# Patient Record
Sex: Female | Born: 1980 | Race: White | Hispanic: No | Marital: Single | Smoking: Current every day smoker
Health system: Southern US, Community
[De-identification: ages and names within clinical notes are randomized; demographics above are authoritative.]

## PROBLEM LIST (undated history)

## (undated) DIAGNOSIS — Z21 Asymptomatic human immunodeficiency virus [HIV] infection status: Secondary | ICD-10-CM

## (undated) DIAGNOSIS — K859 Acute pancreatitis without necrosis or infection, unspecified: Secondary | ICD-10-CM

## (undated) DIAGNOSIS — B20 Human immunodeficiency virus [HIV] disease: Secondary | ICD-10-CM

## (undated) HISTORY — PX: OTHER SURGICAL HISTORY: SHX169

## (undated) HISTORY — PX: CHOLECYSTECTOMY: SHX55

---

## 2014-07-12 ENCOUNTER — Inpatient Hospital Stay (HOSPITAL_COMMUNITY)
Admit: 2014-07-12 | Discharge: 2014-07-16 | DRG: 975 | Disposition: A | Discharge status: Home / Self Care | Payer: Medicaid - Out of State | Source: Other Acute Inpatient Hospital | Attending: Internal Medicine | Admitting: Internal Medicine

## 2014-07-12 ENCOUNTER — Telehealth: Payer: Self-pay | Admitting: Internal Medicine

## 2014-07-12 ENCOUNTER — Encounter (HOSPITAL_COMMUNITY): Payer: Self-pay | Admitting: Internal Medicine

## 2014-07-12 ENCOUNTER — Inpatient Hospital Stay (HOSPITAL_COMMUNITY)
Admission: AD | Admit: 2014-07-12 | Discharge status: Absent without leave | Payer: Medicaid - Out of State | Source: Other Acute Inpatient Hospital | Attending: Internal Medicine | Admitting: Internal Medicine

## 2014-07-12 DIAGNOSIS — Z9049 Acquired absence of other specified parts of digestive tract: Secondary | ICD-10-CM | POA: Diagnosis present

## 2014-07-12 DIAGNOSIS — N2589 Other disorders resulting from impaired renal tubular function: Secondary | ICD-10-CM | POA: Diagnosis present

## 2014-07-12 DIAGNOSIS — R0602 Shortness of breath: Secondary | ICD-10-CM | POA: Diagnosis present

## 2014-07-12 DIAGNOSIS — B59 Pneumocystosis: Principal | ICD-10-CM | POA: Diagnosis present

## 2014-07-12 DIAGNOSIS — B20 Human immunodeficiency virus [HIV] disease: Secondary | ICD-10-CM | POA: Diagnosis present

## 2014-07-12 DIAGNOSIS — N179 Acute kidney failure, unspecified: Secondary | ICD-10-CM

## 2014-07-12 DIAGNOSIS — Z9119 Patient's noncompliance with other medical treatment and regimen: Secondary | ICD-10-CM | POA: Diagnosis present

## 2014-07-12 DIAGNOSIS — F17210 Nicotine dependence, cigarettes, uncomplicated: Secondary | ICD-10-CM | POA: Diagnosis present

## 2014-07-12 DIAGNOSIS — R1013 Epigastric pain: Secondary | ICD-10-CM | POA: Diagnosis present

## 2014-07-12 DIAGNOSIS — F1021 Alcohol dependence, in remission: Secondary | ICD-10-CM | POA: Diagnosis present

## 2014-07-12 DIAGNOSIS — J13 Pneumonia due to Streptococcus pneumoniae: Secondary | ICD-10-CM | POA: Diagnosis present

## 2014-07-12 DIAGNOSIS — J189 Pneumonia, unspecified organism: Secondary | ICD-10-CM | POA: Diagnosis present

## 2014-07-12 DIAGNOSIS — Z21 Asymptomatic human immunodeficiency virus [HIV] infection status: Secondary | ICD-10-CM | POA: Diagnosis present

## 2014-07-12 DIAGNOSIS — R109 Unspecified abdominal pain: Secondary | ICD-10-CM

## 2014-07-12 HISTORY — DX: Acute pancreatitis without necrosis or infection, unspecified: K85.90

## 2014-07-12 HISTORY — DX: Human immunodeficiency virus (HIV) disease: B20

## 2014-07-12 HISTORY — DX: Asymptomatic human immunodeficiency virus (hiv) infection status: Z21

## 2014-07-12 MED ORDER — MORPHINE SULFATE 2 MG/ML IJ SOLN
0.5000 mg | INTRAMUSCULAR | Status: DC | PRN
  Administered 2014-07-13 – 2014-07-14 (×8): 0.5 mg via INTRAVENOUS
  Filled 2014-07-12 (×8): qty 1

## 2014-07-12 NOTE — Telephone Encounter (Signed)
Chest x-ray findings from [facility] showed bilateral pneumonia. Creatinine 0.8 06/13/2014. Solumedrol 125 mg. Started on Bactrim, Ceftriaxone, and Azithromycin at [facility].  Kielan Dreisbach A, MD 07/12/2014, 4:49 PM

## 2014-07-12 NOTE — Telephone Encounter (Signed)
Name: Ardelle LeschesCrystal C Diemer MRN: [id number] DOB: 03/07/1981  Brief History: 34 year old female with history of HIV, COPD, chronic bronchitis, chronic pain syndrome, bipolar disorder who presented to [facility] with complaints of fever.  Patient reported fever of 104 last night.  Patient was found to have a WBC of 1.0, BUN of 40, creatinine 2.0 (Acute renal failure).  Patient found to be in acute renal failure.  ABG showed pH 7.37, PO2 70, PCO2 of 26.  Per ED physician, patient has infectious diseases doctor in [place].  Vitals: 98.7, 96, 20, 104/53, 97% on room air.  Bed requested: Medical bed, depending [facility].  Andreas BlowerEDDY,Trygg Mantz A, MD 07/12/2014, 4:47 PM

## 2014-07-12 NOTE — Progress Notes (Signed)
CRITICAL VALUE ALERT  Critical value received:  WBC 1.1  Date of notification:  07/12/2014  Time of notification:  11:48 PM  Critical value read back:Yes.    Nurse who received alert:  Joya SalmBindu Joy, RN  MD notified (1st page):  Toniann FailKakrakandy, MD  Time of first page:  11:49 PM  MD notified (2nd page):  Time of second page:  Responding MD:  Toniann FailKakrakandy, MD  Time MD responded:  11:49 PM

## 2014-07-13 ENCOUNTER — Encounter (HOSPITAL_COMMUNITY): Payer: Self-pay | Admitting: Radiology

## 2014-07-13 ENCOUNTER — Inpatient Hospital Stay (HOSPITAL_COMMUNITY): Payer: Medicaid - Out of State

## 2014-07-13 DIAGNOSIS — R1013 Epigastric pain: Secondary | ICD-10-CM | POA: Diagnosis present

## 2014-07-13 DIAGNOSIS — J189 Pneumonia, unspecified organism: Secondary | ICD-10-CM | POA: Diagnosis present

## 2014-07-13 DIAGNOSIS — N179 Acute kidney failure, unspecified: Secondary | ICD-10-CM | POA: Diagnosis present

## 2014-07-13 LAB — CREATININE, URINE, RANDOM: Creatinine, Urine: 49.6 mg/dL

## 2014-07-13 LAB — CBC WITH DIFFERENTIAL/PLATELET
Basophils Absolute: 0 10*3/uL (ref 0.0–0.1)
Basophils Relative: 0 % (ref 0–1)
EOS PCT: 1 % (ref 0–5)
Eosinophils Absolute: 0 10*3/uL (ref 0.0–0.7)
HCT: 28.2 % — ABNORMAL LOW (ref 36.0–46.0)
Hemoglobin: 9.6 g/dL — ABNORMAL LOW (ref 12.0–15.0)
LYMPHS ABS: 0.3 10*3/uL — AB (ref 0.7–4.0)
Lymphocytes Relative: 22 % (ref 12–46)
MCH: 34.9 pg — AB (ref 26.0–34.0)
MCHC: 34 g/dL (ref 30.0–36.0)
MCV: 102.5 fL — AB (ref 78.0–100.0)
Monocytes Absolute: 0.2 10*3/uL (ref 0.1–1.0)
Monocytes Relative: 18 % — ABNORMAL HIGH (ref 3–12)
Neutro Abs: 0.7 10*3/uL — ABNORMAL LOW (ref 1.7–7.7)
Neutrophils Relative %: 59 % (ref 43–77)
PLATELETS: 134 10*3/uL — AB (ref 150–400)
RBC: 2.75 MIL/uL — AB (ref 3.87–5.11)
RDW: 13.8 % (ref 11.5–15.5)
WBC: 1.2 10*3/uL — CL (ref 4.0–10.5)

## 2014-07-13 LAB — COMPREHENSIVE METABOLIC PANEL
ALBUMIN: 3.2 g/dL — AB (ref 3.5–5.2)
ALT: 19 U/L (ref 0–35)
ANION GAP: 9 (ref 5–15)
AST: 30 U/L (ref 0–37)
Alkaline Phosphatase: 105 U/L (ref 39–117)
BUN: 33 mg/dL — ABNORMAL HIGH (ref 6–23)
CALCIUM: 8.5 mg/dL (ref 8.4–10.5)
CO2: 19 mmol/L (ref 19–32)
CREATININE: 1.49 mg/dL — AB (ref 0.50–1.10)
Chloride: 113 mEq/L — ABNORMAL HIGH (ref 96–112)
GFR calc Af Amer: 52 mL/min — ABNORMAL LOW (ref >90)
GFR calc non Af Amer: 45 mL/min — ABNORMAL LOW (ref >90)
Glucose, Bld: 137 mg/dL — ABNORMAL HIGH (ref 70–99)
Potassium: 3.8 mmol/L (ref 3.5–5.1)
Sodium: 141 mmol/L (ref 135–145)
TOTAL PROTEIN: 8 g/dL (ref 6.0–8.3)
Total Bilirubin: 0.7 mg/dL (ref 0.3–1.2)

## 2014-07-13 LAB — URINALYSIS W MICROSCOPIC (NOT AT ARMC)
Bilirubin Urine: NEGATIVE
Glucose, UA: NEGATIVE mg/dL
Hgb urine dipstick: NEGATIVE
Ketones, ur: NEGATIVE mg/dL
Leukocytes, UA: NEGATIVE
Nitrite: NEGATIVE
Protein, ur: 100 mg/dL — AB
Specific Gravity, Urine: 1.014 (ref 1.005–1.030)
Urobilinogen, UA: 1 mg/dL (ref 0.0–1.0)
pH: 6.5 (ref 5.0–8.0)

## 2014-07-13 LAB — BRAIN NATRIURETIC PEPTIDE: B NATRIURETIC PEPTIDE 5: 117.9 pg/mL — AB (ref 0.0–100.0)

## 2014-07-13 LAB — STREP PNEUMONIAE URINARY ANTIGEN: STREP PNEUMO URINARY ANTIGEN: POSITIVE — AB

## 2014-07-13 LAB — SODIUM, URINE, RANDOM: Sodium, Ur: 80 mmol/L

## 2014-07-13 LAB — PREGNANCY, URINE: PREG TEST UR: NEGATIVE

## 2014-07-13 LAB — LACTATE DEHYDROGENASE: LDH: 263 U/L — ABNORMAL HIGH (ref 94–250)

## 2014-07-13 LAB — LIPASE, BLOOD: LIPASE: 24 U/L (ref 11–59)

## 2014-07-13 LAB — TROPONIN I

## 2014-07-13 MED ORDER — IOHEXOL 300 MG/ML  SOLN: 100.0000 mL | Freq: Once | INTRAMUSCULAR | Status: AC | PRN

## 2014-07-13 MED ORDER — QUETIAPINE FUMARATE 300 MG PO TABS
600.0000 mg | ORAL_TABLET | Freq: Every day | ORAL | Status: DC
  Administered 2014-07-13 – 2014-07-15 (×4): 600 mg via ORAL
  Filled 2014-07-13 (×5): qty 2

## 2014-07-13 MED ORDER — AZITHROMYCIN 500 MG PO TABS
500.0000 mg | ORAL_TABLET | ORAL | Status: DC
Start: 2014-07-13 — End: 2014-07-15
  Administered 2014-07-13 – 2014-07-15 (×3): 500 mg via ORAL
  Filled 2014-07-13 (×3): qty 1

## 2014-07-13 MED ORDER — CEFTRIAXONE SODIUM IN DEXTROSE 20 MG/ML IV SOLN
1.0000 g | INTRAVENOUS | Status: DC
  Administered 2014-07-13 – 2014-07-16 (×4): 1 g via INTRAVENOUS
  Filled 2014-07-13 (×5): qty 50

## 2014-07-13 MED ORDER — RITONAVIR 100 MG PO CAPS
100.0000 mg | ORAL_CAPSULE | Freq: Every day | ORAL | Status: DC
  Filled 2014-07-13: qty 1

## 2014-07-13 MED ORDER — EMTRICITABINE-TENOFOVIR DF 200-300 MG PO TABS
1.0000 | ORAL_TABLET | Freq: Every day | ORAL | Status: DC
  Administered 2014-07-13 (×2): 1 via ORAL
  Filled 2014-07-13 (×3): qty 1

## 2014-07-13 MED ORDER — ENOXAPARIN SODIUM 40 MG/0.4ML ~~LOC~~ SOLN
40.0000 mg | SUBCUTANEOUS | Status: DC
  Administered 2014-07-15: 40 mg via SUBCUTANEOUS
  Filled 2014-07-13 (×4): qty 0.4

## 2014-07-13 MED ORDER — SULFAMETHOXAZOLE-TRIMETHOPRIM 400-80 MG/5ML IV SOLN
320.0000 mg | Freq: Four times a day (QID) | INTRAVENOUS | Status: DC
  Administered 2014-07-13 – 2014-07-14 (×6): 320 mg via INTRAVENOUS
  Filled 2014-07-13 (×13): qty 20

## 2014-07-13 MED ORDER — GABAPENTIN 400 MG PO CAPS
400.0000 mg | ORAL_CAPSULE | Freq: Four times a day (QID) | ORAL | Status: DC
  Administered 2014-07-13 – 2014-07-16 (×13): 400 mg via ORAL
  Filled 2014-07-13 (×16): qty 1

## 2014-07-13 MED ORDER — NICOTINE 21 MG/24HR TD PT24
21.0000 mg | MEDICATED_PATCH | Freq: Every day | TRANSDERMAL | Status: DC
  Administered 2014-07-13 – 2014-07-16 (×4): 21 mg via TRANSDERMAL
  Filled 2014-07-13 (×4): qty 1

## 2014-07-13 MED ORDER — RITONAVIR 100 MG PO TABS
100.0000 mg | ORAL_TABLET | Freq: Every day | ORAL | Status: DC
  Administered 2014-07-13 – 2014-07-15 (×4): 100 mg via ORAL
  Filled 2014-07-13 (×5): qty 1

## 2014-07-13 MED ORDER — ATAZANAVIR SULFATE 150 MG PO CAPS
300.0000 mg | ORAL_CAPSULE | Freq: Every day | ORAL | Status: DC
  Administered 2014-07-13 – 2014-07-15 (×4): 300 mg via ORAL
  Filled 2014-07-13 (×5): qty 2

## 2014-07-13 MED ORDER — SODIUM CHLORIDE 0.9 % IV SOLN
INTRAVENOUS | Status: DC
  Administered 2014-07-13: 01:00:00 via INTRAVENOUS

## 2014-07-13 NOTE — Progress Notes (Signed)
PROGRESS NOTE  Sherry Strickland ZOX:[id number] DOB: 15-Jan-1981 DOA: 07/12/2014 PCP: Sherry Strickland  Brief history 34 year old female with a history of HIV presented with 4 day history of shortness of breath, cough, and fevers. The [facility] and transferred to Sherry Strickland. Chest x-ray report for bibasilar infiltrates. The Strickland had been noncompliant with her antiretroviral therapy. She states that she just started back on Norvir, Truvada, and Reyataz. However her history is very inconsistent. She states that she last saw her infectious disease physician 6 weeks ago, but interestingly, the Strickland was not placed on PCP or MAI prophylaxis. However, she states that she was started on the above antiretroviral regimen at that time. She decided not to take it up until 3 weeks ago.  She states that his absolute CD4 count was 2, five months ago.  The Strickland states that she was diagnosed with PCP pneumonia when she was diagnosed with HIV 10 years ago. She states that she has not been on an anti-retroviral regimen up until 6 weeks ago. Assessment/Plan: Pneumonia -Although the Strickland has a positive urine Streptococcus pneumonia antigen, this is nonspecific -Continue intravenous trimethoprim sulfamethoxazole -Continue treatment for CAP with ceftriaxone and azithromycin -The Strickland is not hypoxemic--PaO2 70, oxygen saturation 97% on room air -Sputum for PCP--not sure if the Strickland is able to produce any at this time but will try -LDH is minimally elevated, but again this is nonspecific Tobacco abuse -She continues to smoke 2 packs per day -Tobacco cessation discussed -NicoDerm patch Abdominal pain -Etiology unclear -CT abdomen and pelvis without contrast Acute kidney injury -Strickland [facility] -Serum creatinine improved to 1.49 -Unclear renal baseline -Continue IV fluids -Renal ultrasound to clarify if she has echogenic kidneys,  hydronephrosis -Fractional excretion of sodium 1.7% Pancytopenia -Due to sepsis in the setting of AIDS Medical noncompliance -pt counseled   Family Communication:   Pt at beside Disposition Plan:   Home when medically stable     Antibiotics:  Ceftriaxone  07/13/14>>>  azithro 07/13/14>>>  TMP/SMZ 07/13/14>>>    Procedures/Studies: Dg Chest Port 1 View  07/13/2014   CLINICAL DATA:  Pneumonia.  Cough.  Shortness of breath.  EXAM: PORTABLE CHEST - 1 VIEW  COMPARISON:  07/12/2014.  FINDINGS: Mediastinum and hilar structures normal. Heart size is stable. Persistent bibasilar pulmonary infiltrates are noted most consistent with pneumonia. Pulmonary edema cannot be excluded. Sherry pleural effusion or pneumothorax.  IMPRESSION: Persistent patchy bibasilar pulmonary infiltrates most consistent with pneumonia. Pulmonary edema cannot be excluded.   Electronically Signed   By: Sherry Strickland  Strickland   On: 07/13/2014 07:55         Subjective: Strickland continues to complain of shortness of breath not much better than yesterday. Denies any nausea, vomiting, diarrhea, dysuria, hematuria. Continues to complain of epigastric and left lower quadrant abdominal pain.  Objective: Filed Vitals:   07/12/14 2300 07/13/14 0656  BP:  103/52  Pulse:  85  Temp:  98.6 F (37 C)  TempSrc:  Oral  Resp:  21  Height: 5\' 4"  (1.626 m)   Weight: 89.631 kg (197 lb 9.6 oz)   SpO2:  97%    Intake/Output Summary (Last 24 hours) at 07/13/14 1128 Last data filed at 07/13/14 1028  Gross per 24 hour  Intake    240 ml  Output      0 ml  Net    240 ml  Weight change:  Exam:   General:  Pt is alert, follows commands appropriately, not in acute distress  HEENT: Sherry icterus, Sherry thrush,  [place]/AT  Cardiovascular: RRR, S1/S2, Sherry rubs, Sherry gallops  Respiratory: Bibasilar crackles. Sherry wheezing  Abdomen: Soft/+BS, non tender, non distended, Sherry guarding  Extremities: 1+LE edema, Sherry lymphangitis, Sherry petechiae, Sherry  rashes, Sherry synovitis  Data Reviewed: Basic Metabolic Panel:  Recent Labs Lab 07/13/14 0103  NA 141  K 3.8  CL 113*  CO2 19  GLUCOSE 137*  BUN 33*  CREATININE 1.49*  CALCIUM 8.5   Liver Function Tests:  Recent Labs Lab 07/13/14 0103  AST 30  ALT 19  ALKPHOS 105  BILITOT 0.7  PROT 8.0  ALBUMIN 3.2*    Recent Labs Lab 07/13/14 0103  LIPASE 24   Sherry results for input(s): AMMONIA in the last 168 hours. CBC:  Recent Labs Lab 07/13/14 0103  WBC 1.2*  NEUTROABS 0.7*  HGB 9.6*  HCT 28.2*  MCV 102.5*  PLT 134*   Cardiac Enzymes:  Recent Labs Lab 07/13/14 0103  TROPONINI <0.03   BNP: Invalid input(s): POCBNP CBG: Sherry results for input(s): GLUCAP in the last 168 hours.  Sherry results found for this or any previous visit (from the past 240 hour(s)).   Scheduled Meds: . atazanavir  300 mg Oral Q supper  . azithromycin  500 mg Oral Q24H  . cefTRIAXone (ROCEPHIN)  IV  1 g Intravenous Q24H  . emtricitabine-tenofovir  1 tablet Oral QHS  . enoxaparin (LOVENOX) injection  40 mg Subcutaneous Q24H  . gabapentin  400 mg Oral QID  . QUEtiapine  600 mg Oral QHS  . ritonavir  100 mg Oral Q supper  . sulfamethoxazole-trimethoprim  320 mg Intravenous Q6H   Continuous Infusions: . sodium chloride 50 mL/hr at 07/13/14 0114     Sherry Maalouf, DO  [facility] Pager [phone]  If 7PM-7AM, please contact night-coverage [email or web address] Password [email or web address] 07/13/2014, 11:28 AM   LOS: 1 day

## 2014-07-13 NOTE — Progress Notes (Signed)
UR Completed.  [phone]  

## 2014-07-13 NOTE — Progress Notes (Signed)
ANTIBIOTIC CONSULT NOTE - INITIAL  Pharmacy Consult for Septra Indication: PCP  No Known Allergies  Patient Measurements: Height: 5\' 4"  (162.6 cm) Weight: 197 lb 9.6 oz (89.631 kg) IBW/kg (Calculated) : 54.7  Vital Signs:   Intake/Output from previous day:   Intake/Output from this shift:    Labs (at [facility]) : WBC  1.0 Hgb  10.9 Hct  31.2 Plt  120  SCr 2.4 ( CrCl ~ 40 mL/min No results for input(s): WBC, HGB, PLT, LABCREA, CREATININE in the last 72 hours. CrCl cannot be calculated (Patient has no serum creatinine result on file.). No results for input(s): VANCOTROUGH, VANCOPEAK, VANCORANDOM, GENTTROUGH, GENTPEAK, GENTRANDOM, TOBRATROUGH, TOBRAPEAK, TOBRARND, AMIKACINPEAK, AMIKACINTROU, AMIKACIN in the last 72 hours.   Microbiology: No results found for this or any previous visit (from the past 720 hour(s)).  Medical History: Past Medical History  Diagnosis Date  . HIV (human immunodeficiency virus infection)   . Pancreatitis     Medications:  Reyataz  Truvada  Norvir  Neurontin  Seroquel    Assessment: 34 yo female with PNA, HIV+, for empiric antibiotics  Plan:  Septra 320 mg IV q6h  Jevonte Clanton, Gary FleetGregory Vernon 07/13/2014,12:45 AM

## 2014-07-13 NOTE — H&P (Signed)
[facility] History and Physical  Sherry Strickland ZOX:[id number] DOB: May 16, 1981 DOA: 07/12/2014  Referring physician: [facility] was [facility]. PCP: No PCP Per Patient   Chief Complaint: Shortness of breath.  HPI: Sherry Strickland is a 34 y.o. female with history of HIV, as per the patient last CD4 count was 2, 6 months ago presents [facility] because of shortness of breath. Patient states she has been short of breath over the last 4 days with productive cough and subjective feeling of fever and chills. Denies any chest pain. In the ER chest x-ray showed bilateral lower lobe opacities concerning for pneumonia. Patient was [facility] as [facility] did not have any infectious disease consultant. On my exam patient is not in distress and is not hypoxic. Patient has had recent cholecystectomy last month and also was told she has had pancreatitis 2 months ago for which she was hospitalized and patient states since then she has stopped drinking alcohol. [facility] shows pancytopenia with WBC count of 1 and hemoglobin of 10.9 and platelets of 120 creatinine was around 2.4 with a bicarbonate of 18. We do not have access to patient's baseline labs. Troponins was negative LFTs showed AST and ALT of 20 bilirubin was within acceptable limits. Drug screen was positive for benzodiazepine.   Review of Systems: As presented in the history of presenting illness, rest negative.  Past Medical History  Diagnosis Date  . HIV (human immunodeficiency virus infection)   . Pancreatitis    Past Surgical History  Procedure Laterality Date  . Cholecystectomy    . Left tibia surgery     Social History:  reports that she has been smoking.  She does not have any smokeless tobacco history on file. She reports that she does not drink alcohol or use illicit drugs. Where does patient live home. Can patient participate in  ADLs? Yes.  No Known Allergies  Family History:  Family History  Problem Relation Age of Onset  . Bipolar disorder Mother       Prior to Admission medications   Medication Sig Start Date End Date Taking? Authorizing Provider  atazanavir (REYATAZ) 150 MG capsule Take 300 mg by mouth at bedtime.   Yes Historical Provider, MD  emtricitabine-tenofovir (TRUVADA) 200-300 MG per tablet Take 1 tablet by mouth at bedtime.   Yes Historical Provider, MD  gabapentin (NEURONTIN) 400 MG capsule Take 400 mg by mouth 4 (four) times daily.   Yes Historical Provider, MD  PRESCRIPTION MEDICATION Inhale 2 puffs into the lungs 2 (two) times daily as needed (wheezing, shortness of breath).    Yes Historical Provider, MD  QUEtiapine (SEROQUEL) 300 MG tablet Take 600 mg by mouth at bedtime.   Yes Historical Provider, MD  ritonavir (NORVIR) 100 MG capsule Take 100 mg by mouth at bedtime.   Yes Historical Provider, MD    Physical Exam: There were no vitals filed for this visit.   General:  Moderately built and nourished.  Eyes: Anicteric no pallor.  ENT: No discharge from the ears eyes nose or mouth.  Neck: No mass felt. No JVD appreciated.  Cardiovascular: S1 and S2 heard.  Respiratory: No rhonchi or crepitations.  Abdomen: Soft nontender bowel sounds present.  Skin: No rash.  Musculoskeletal: No edema.  Psychiatric: Appears normal.  Neurologic: Alert awake oriented to time place and person. Moves all extremities.  Labs on Admission:  Basic Metabolic Panel: No  results for input(s): NA, K, CL, CO2, GLUCOSE, BUN, CREATININE, CALCIUM, MG, PHOS in the last 168 hours. Liver Function Tests: No results for input(s): AST, ALT, ALKPHOS, BILITOT, PROT, ALBUMIN in the last 168 hours. No results for input(s): LIPASE, AMYLASE in the last 168 hours. No results for input(s): AMMONIA in the last 168 hours. CBC: No results for input(s): WBC, NEUTROABS, HGB, HCT, MCV, PLT in the last 168 hours. Cardiac  Enzymes: No results for input(s): CKTOTAL, CKMB, CKMBINDEX, TROPONINI in the last 168 hours.  BNP (last 3 results) No results for input(s): PROBNP in the last 8760 hours. CBG: No results for input(s): GLUCAP in the last 168 hours.  Radiological Exams on Admission: No results found.  EKG: Independently reviewed. E[facility] done at [facility] showed normal sinus rhythm.  Assessment/Plan Principal Problem:   CAP (community acquired pneumonia) Active Problems:   HIV (human immunodeficiency virus infection)   Pneumonia   Acute renal failure   Abdominal pain, epigastric   1. Community acquired pneumonia - patient has been placed on ceftriaxone and Zithromax. Since patient has HIV and CD4 counts were low at this time patient has been placed on Bactrim per pharmacy to dose for pneumocystis pneumonia. Check LDH sputum cultures repeat chest x-ray in a.m. Closely observe for now. Since patient is not hypoxic I have not placed patient on prednisone. Check urine strep and Legionella antigen. 2. HIV/AIDS - patient states her last CD4 count was 2. Patient states she has been taking her antiretrovirals over the last 3 weeks regularly. Check CD4 count and viral load. May consult infectious disease in a.m. 3. Renal failure probably acute - patient's creatinine was around 2.4 and we do not have any labs to compare with. Repeat labs closely follow intake and output and metabolic panel. If patient's creatinine shows an increasing trend and may have to change Bactrim. 4. Epigastric discomfort with history of alcoholic pancreatitis - check lipase levels. 5. Pancytopenia - may be related to HIV. Patient also has history of alcoholism and not sure if patient has cirrhosis. Closely follow CBC. 6. Recent cholecystectomy.  Repeat labs has been ordered.   DVT Prophylaxis Lovenox.  Code Status: Full code.  Family Communication: None.  Disposition Plan: Admit to inpatient.    KAKRAKANDY,ARSHAD N. [facility] Pager [phone].  If 7PM-7AM, please contact night-coverage [email or web address] Password [facility] 07/13/2014, 12:07 AM

## 2014-07-14 DIAGNOSIS — B59 Pneumocystosis: Secondary | ICD-10-CM | POA: Insufficient documentation

## 2014-07-14 DIAGNOSIS — J13 Pneumonia due to Streptococcus pneumoniae: Secondary | ICD-10-CM | POA: Insufficient documentation

## 2014-07-14 DIAGNOSIS — N2589 Other disorders resulting from impaired renal tubular function: Secondary | ICD-10-CM | POA: Insufficient documentation

## 2014-07-14 DIAGNOSIS — N179 Acute kidney failure, unspecified: Secondary | ICD-10-CM | POA: Insufficient documentation

## 2014-07-14 DIAGNOSIS — R1013 Epigastric pain: Secondary | ICD-10-CM

## 2014-07-14 DIAGNOSIS — J189 Pneumonia, unspecified organism: Secondary | ICD-10-CM

## 2014-07-14 DIAGNOSIS — Z21 Asymptomatic human immunodeficiency virus [HIV] infection status: Secondary | ICD-10-CM

## 2014-07-14 LAB — EXPECTORATED SPUTUM ASSESSMENT W GRAM STAIN, RFLX TO RESP C

## 2014-07-14 LAB — LACTIC ACID, PLASMA: LACTIC ACID, VENOUS: 1.9 mmol/L (ref 0.5–2.2)

## 2014-07-14 LAB — BASIC METABOLIC PANEL
Anion gap: 8 (ref 5–15)
BUN: 24 mg/dL — ABNORMAL HIGH (ref 6–23)
CALCIUM: 8.4 mg/dL (ref 8.4–10.5)
CO2: 14 mmol/L — ABNORMAL LOW (ref 19–32)
Chloride: 113 mEq/L — ABNORMAL HIGH (ref 96–112)
Creatinine, Ser: 1.06 mg/dL (ref 0.50–1.10)
GFR calc non Af Amer: 68 mL/min — ABNORMAL LOW (ref >90)
GFR, EST AFRICAN AMERICAN: 78 mL/min — AB (ref >90)
GLUCOSE: 137 mg/dL — AB (ref 70–99)
Potassium: 4.2 mmol/L (ref 3.5–5.1)
Sodium: 135 mmol/L (ref 135–145)

## 2014-07-14 LAB — EXPECTORATED SPUTUM ASSESSMENT W REFEX TO RESP CULTURE

## 2014-07-14 LAB — HIV-1 RNA QUANT-NO REFLEX-BLD
HIV 1 RNA Quant: 24731 copies/mL — ABNORMAL HIGH (ref <20)
HIV-1 RNA QUANT, LOG: 4.39 {Log} — AB (ref <1.30)

## 2014-07-14 LAB — CBC
HCT: 26.8 % — ABNORMAL LOW (ref 36.0–46.0)
Hemoglobin: 9.2 g/dL — ABNORMAL LOW (ref 12.0–15.0)
MCH: 35.5 pg — ABNORMAL HIGH (ref 26.0–34.0)
MCHC: 34.3 g/dL (ref 30.0–36.0)
MCV: 103.5 fL — AB (ref 78.0–100.0)
PLATELETS: 154 10*3/uL (ref 150–400)
RBC: 2.59 MIL/uL — ABNORMAL LOW (ref 3.87–5.11)
RDW: 14.1 % (ref 11.5–15.5)
WBC: 1.9 10*3/uL — AB (ref 4.0–10.5)

## 2014-07-14 LAB — INFLUENZA PANEL BY PCR (TYPE A & B)
H1N1FLUPCR: NOT DETECTED
INFLAPCR: NEGATIVE
INFLBPCR: NEGATIVE

## 2014-07-14 LAB — LEGIONELLA ANTIGEN, URINE

## 2014-07-14 MED ORDER — EMTRICITABINE-TENOFOVIR DF 200-300 MG PO TABS
1.0000 | ORAL_TABLET | Freq: Every day | ORAL | Status: DC
  Administered 2014-07-15: 1 via ORAL
  Filled 2014-07-14 (×2): qty 1

## 2014-07-14 MED ORDER — GI COCKTAIL ~~LOC~~
30.0000 mL | Freq: Three times a day (TID) | ORAL | Status: DC | PRN
  Filled 2014-07-14: qty 30

## 2014-07-14 MED ORDER — PANTOPRAZOLE SODIUM 40 MG PO TBEC: 40.0000 mg | DELAYED_RELEASE_TABLET | Freq: Every day | ORAL | Status: DC

## 2014-07-14 MED ORDER — FAMOTIDINE 20 MG PO TABS
20.0000 mg | ORAL_TABLET | Freq: Every day | ORAL | Status: DC
  Administered 2014-07-15 – 2014-07-16 (×2): 20 mg via ORAL
  Filled 2014-07-14 (×2): qty 1

## 2014-07-14 MED ORDER — MORPHINE SULFATE 2 MG/ML IJ SOLN: 1.0000 mg | INTRAMUSCULAR | Status: DC | PRN

## 2014-07-14 MED ORDER — OXYCODONE HCL 5 MG PO TABS
5.0000 mg | ORAL_TABLET | Freq: Four times a day (QID) | ORAL | Status: DC | PRN
Start: 2014-07-14 — End: 2014-07-16
  Administered 2014-07-14 – 2014-07-16 (×8): 5 mg via ORAL
  Filled 2014-07-14 (×8): qty 1

## 2014-07-14 MED ORDER — PRIMAQUINE PHOSPHATE 26.3 MG PO TABS
30.0000 mg | ORAL_TABLET | Freq: Every day | ORAL | Status: DC
  Administered 2014-07-14 – 2014-07-16 (×3): 30 mg via ORAL
  Filled 2014-07-14 (×3): qty 2

## 2014-07-14 MED ORDER — ALUM & MAG HYDROXIDE-SIMETH 200-200-20 MG/5ML PO SUSP: 30.0000 mL | Freq: Four times a day (QID) | ORAL | Status: DC | PRN

## 2014-07-14 MED ORDER — CLINDAMYCIN PHOSPHATE 900 MG/50ML IV SOLN
900.0000 mg | Freq: Three times a day (TID) | INTRAVENOUS | Status: DC
  Administered 2014-07-14 – 2014-07-15 (×3): 900 mg via INTRAVENOUS
  Filled 2014-07-14 (×5): qty 50

## 2014-07-14 NOTE — Progress Notes (Signed)
PROGRESS NOTE  Sherry Strickland ZOX:[id number] DOB: 1981/04/11 DOA: 07/12/2014 PCP: No PCP Per Patient  HPI/Subjective: 34 y/o female with hx of HIV and non-compliance with HIV medications, per pt. Has a CD4 count of 2 5 months ago.  Presented with a 4 day hx of SOB, productive cough, and fevers with chills.  The patient was at [facility] and was transferred to [facility] because [facility] doesn't have and ID consultant.  Chest x-ray in the ED showed bilateral lower lobe infiltrates most consistent with pneumonia. The patient was diagnosed with PCP pneumonia when she was diagnosed with HIV 10 years ago. Pt decided to start taking her antiretroviral regiman 3 weeks ago: Norvir, Truvada, and Reyataz.    Hx of cholecystectomy and pancreatitis from alcohol use 2 months ago.    Assessment/Plan:  Pneumonia: Not sure if Bacterial CAP vs PCP (given hx of HIV)-although Urine Strep Antigen positive Not hypoxic, not toxic appearing. No fever, but leukopenic, suspect somewhat improved Continue empiric Rocephin/Zithromax and Bactrim ID consult for Abx management  Abdominal Pain: Gives prior hx of pancreatitis following binging on Alcohol, claims underwent cholecystectomy a few months back. CT Abd on 1/12 negative, Lipase within normal limits as well, belly is soft-with some mild epigastric tenderness Start Pepcid, Maalox prn,supportive care with prn Narcotics Follow  ARF: Secondary to pre-renal Azotemia Resolved with IVF Monitor periodically-on Bactrim  Pancytopenia: Secondary to HIV/AID's Follow CBC periodcially  Non Anion Gap Acidosis: ?etiology-?from ARF-bicarb lagging behind renal improvement. ?from Truvada-check lactic acid  Tobacco Abuse: Counseled Continue Transdermal Nicotine  DVT Prophylaxis:  Lovenox  Code Status: Full Family Communication: no family at bedside Disposition Plan: home when medically  stable   Consultants:  ID  Procedures:  None  Antibiotics: Anti-infectives    Start     Dose/Rate Route Frequency Ordered Stop   07/13/14 1400  cefTRIAXone (ROCEPHIN) 1 g in dextrose 5 % 50 mL IVPB - Premix     1 g100 mL/hr over 30 Minutes Intravenous Every 24 hours 07/13/14 0006 07/20/14 1359   07/13/14 1000  azithromycin (ZITHROMAX) tablet 500 mg     500 mg Oral Every 24 hours 07/13/14 0006 07/20/14 0959   07/13/14 0200  sulfamethoxazole-trimethoprim (BACTRIM) 320 mg in dextrose 5 % 500 mL IVPB     320 mg346.7 mL/hr over 90 Minutes Intravenous Every 6 hours 07/13/14 0050     07/13/14 0100  ritonavir (NORVIR) tablet 100 mg     100 mg Oral Daily with supper 07/13/14 0057     07/13/14 0045  atazanavir (REYATAZ) capsule 300 mg     300 mg Oral Daily with supper 07/13/14 0006     07/13/14 0045  emtricitabine-tenofovir (TRUVADA) 200-300 MG per tablet 1 tablet     1 tablet Oral Daily at bedtime 07/13/14 0006     07/13/14 0045  ritonavir (NORVIR) capsule 100 mg  Status:  Discontinued     100 mg Oral Daily with supper 07/13/14 0006 07/13/14 0057       Objective: Filed Vitals:   07/13/14 2148 07/14/14 0610 07/14/14 0805 07/14/14 0807  BP: 124/67 100/38 96/46 112/70  Pulse: 85 80 71   Temp: 97.5 F (36.4 C) 98.3 F (36.8 C)    TempSrc: Oral Oral    Resp: Height:      Weight:      SpO2: 100% 97% 98%     Intake/Output Summary (Last 24 hours) at 07/14/14 1135 Last data filed  at 07/14/14 0720  Gross per 24 hour  Intake   1610 ml  Output      0 ml  Net   1610 ml   Filed Weights   07/12/14 2300  Weight: 89.631 kg (197 lb 9.6 oz)    Exam: General: Well developed, well nourished, NAD, appears stated age  HEENT: No icterus, MMM  Cardiovascular: RRR, S1 S2 auscultated, no rubs, murmurs or gallops.   Respiratory: Clear to auscultation bilaterally with equal chest rise  Abdomen: Soft, tender in epigastric region, nondistended, + bowel sounds. No rebound or  rigidity Extremities: warm dry without cyanosis clubbing or edema.  Neuro: AAOx3. Strength 5/5 in lower extremities  Skin: Without rashes exudates or nodules.   Psych: Normal affect and demeanor with intact judgement and insight   Data Reviewed: Basic Metabolic Panel:  Recent Labs Lab 07/13/14 0103 07/14/14 0528  NA 141 135  K 3.8 4.2  CL 113* 113*  CO2 19 14*  GLUCOSE 137* 137*  BUN 33* 24*  CREATININE 1.49* 1.06  CALCIUM 8.5 8.4   Liver Function Tests:  Recent Labs Lab 07/13/14 0103  AST 30  ALT 19  ALKPHOS 105  BILITOT 0.7  PROT 8.0  ALBUMIN 3.2*    Recent Labs Lab 07/13/14 0103  LIPASE 24   No results for input(s): AMMONIA in the last 168 hours. CBC:  Recent Labs Lab 07/13/14 0103 07/14/14 0528  WBC 1.2* 1.9*  NEUTROABS 0.7*  --   HGB 9.6* 9.2*  HCT 28.2* 26.8*  MCV 102.5* 103.5*  PLT 134* 154   Cardiac Enzymes:  Recent Labs Lab 07/13/14 0103  TROPONINI <0.03   BNP (last 3 results) No results for input(s): PROBNP in the last 8760 hours. CBG: No results for input(s): GLUCAP in the last 168 hours.  No results found for this or any previous visit (from the past 240 hour(s)).   Studies: Ct Abdomen Pelvis Wo Contrast  07/13/2014   CLINICAL DATA:  Upper and central abdominal pain for 4 days.  EXAM: CT ABDOMEN AND PELVIS WITHOUT CONTRAST  TECHNIQUE: Multidetector CT imaging of the abdomen and pelvis was performed following the standard protocol without IV contrast.  COMPARISON:  Renal ultrasound earlier the same day. Chest radiograph earlier the same day. No prior CT.  FINDINGS: There diffuse ground-glass and tree in bud opacities in the right and left lower lobes, right middle lobe, and lingula. The heart is mildly prominent in size.  Clips in the gallbladder fossa from prior cholecystectomy. Unenhanced appearance of the liver is unremarkable. No evidence of biliary dilatation.  The spleen is enlarged measuring 13.2 x 6.0 x 10.4 cm. A splenule is  noted at the anterior spleen. The enhanced pancreas and adrenal glands are normal. The kidneys are symmetric in size without hydronephrosis. Punctate 1-2 mm calcification is seen in the lower right kidney. No focal renal lesion. Both ureters are decompressed.  There are no dilated or thickened bowel loops. The appendix is contrast filled and normal. Moderate volume of colonic stool. No free air, free fluid, or intra-abdominal fluid collection.  The abdominal aorta is normal in caliber. There is no retroperitoneal adenopathy. There is no mesenteric adenopathy.  Within the pelvis, the urinary bladder is physiologically distended. Air is in the bladder may be related to recent catheter placement. There is no bladder wall thickening.  There are no osseous abnormalities. The uterus is normal. There is a 3.9 cm right ovarian cyst. No definite pelvic free fluid. There is  no pelvic adenopathy.  IMPRESSION: 1. No definite acute abnormality in the abdomen/pelvis. 2. Ground-glass and tree-in-bud opacities in the included lung bases, likely bronchiolitis/pneumonia. 3. The spleen is enlarged measuring 13 cm. 4. Punctate nonobstructing right renal stone. 5. Right ovarian cyst measures 3.9 cm.   Electronically Signed   By: Rubye OaksMelanie  Ehinger M.D.   On: 07/13/2014 19:45   [place] Renal  07/13/2014   CLINICAL DATA:  Acute kidney injury.  HIV.  EXAM: RENAL/URINARY TRACT ULTRASOUND COMPLETE  COMPARISON:  None  FINDINGS: Right Kidney:  Length: 12.3 cm. Echogenicity within normal limits. No mass or hydronephrosis visualized.  Left Kidney:  Length: 12.0 cm. Echogenicity within normal limits. No mass or hydronephrosis visualized.  Bladder:  Appears normal for degree of bladder distention.  IMPRESSION: Normal sonographic appearance of both kidneys. No evidence of hydronephrosis.   Electronically Signed   By: Myles RosenthalJohn  Stahl M.D.   On: 07/13/2014 15:27   Dg Chest Port 1 View  07/13/2014   CLINICAL DATA:  Pneumonia.  Cough.  Shortness of breath.   EXAM: PORTABLE CHEST - 1 VIEW  COMPARISON:  07/12/2014.  FINDINGS: Mediastinum and hilar structures normal. Heart size is stable. Persistent bibasilar pulmonary infiltrates are noted most consistent with pneumonia. Pulmonary edema cannot be excluded. No pleural effusion or pneumothorax.  IMPRESSION: Persistent patchy bibasilar pulmonary infiltrates most consistent with pneumonia. Pulmonary edema cannot be excluded.   Electronically Signed   By: Maisie Fushomas  Register   On: 07/13/2014 07:55    Scheduled Meds: . atazanavir  300 mg Oral Q supper  . azithromycin  500 mg Oral Q24H  . cefTRIAXone (ROCEPHIN)  IV  1 g Intravenous Q24H  . emtricitabine-tenofovir  1 tablet Oral QHS  . enoxaparin (LOVENOX) injection  40 mg Subcutaneous Q24H  . gabapentin  400 mg Oral QID  . nicotine  21 mg Transdermal Daily  . QUEtiapine  600 mg Oral QHS  . ritonavir  100 mg Oral Q supper  . sulfamethoxazole-trimethoprim  320 mg Intravenous Q6H   Continuous Infusions:   Principal Problem:   CAP (community acquired pneumonia) Active Problems:   HIV (human immunodeficiency virus infection)   Pneumonia   Acute renal failure   Abdominal pain, epigastric   Caroline E. Howell-Methvin, PA-S    [facility] Pager [phone]. If 7PM-7AM, please contact night-coverage at [email or web address], password [email or web address] 07/14/2014, 11:35 AM  LOS: 2 days    Attending Patient seen and examined, agree with the above assessment and plan. Above documentation was reviewed, necessary changes have been made. Complaining of abdominal pain, suspect this is chronic. Start Pepcid and as needed Maalox. Does not appear to be hypoxic, however have consulted infectious disease for antibiotic management even significantly low CD4 count and possibility for pneumocystis pneumonia. Rest as above.  Windell NorfolkS Ghimire MD

## 2014-07-14 NOTE — Progress Notes (Signed)
RN entered room for IV beeping, pt had just unhooked her IV from IV tubing. RN asked pt to not manipulate IV or tubing.
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
## 2014-07-15 LAB — CBC
HEMATOCRIT: 28.7 % — AB (ref 36.0–46.0)
HEMOGLOBIN: 9.7 g/dL — AB (ref 12.0–15.0)
MCH: 35.1 pg — ABNORMAL HIGH (ref 26.0–34.0)
MCHC: 33.8 g/dL (ref 30.0–36.0)
MCV: 104 fL — ABNORMAL HIGH (ref 78.0–100.0)
Platelets: 194 10*3/uL (ref 150–400)
RBC: 2.76 MIL/uL — AB (ref 3.87–5.11)
RDW: 14.3 % (ref 11.5–15.5)
WBC: 2 10*3/uL — ABNORMAL LOW (ref 4.0–10.5)

## 2014-07-15 LAB — BASIC METABOLIC PANEL
ANION GAP: 14 (ref 5–15)
BUN: 20 mg/dL (ref 6–23)
CO2: 18 mmol/L — ABNORMAL LOW (ref 19–32)
Calcium: 8.8 mg/dL (ref 8.4–10.5)
Chloride: 108 mEq/L (ref 96–112)
Creatinine, Ser: 1.03 mg/dL (ref 0.50–1.10)
GFR calc Af Amer: 81 mL/min — ABNORMAL LOW (ref >90)
GFR, EST NON AFRICAN AMERICAN: 70 mL/min — AB (ref >90)
GLUCOSE: 87 mg/dL (ref 70–99)
Potassium: 4.5 mmol/L (ref 3.5–5.1)
Sodium: 140 mmol/L (ref 135–145)

## 2014-07-15 LAB — PNEUMOCYSTIS JIROVECI SMEAR BY DFA: PNEUMOCYSTIS JIROVECI AG: NEGATIVE

## 2014-07-15 LAB — PHOSPHORUS: Phosphorus: 2.9 mg/dL (ref 2.3–4.6)

## 2014-07-15 MED ORDER — CLINDAMYCIN HCL 300 MG PO CAPS
600.0000 mg | ORAL_CAPSULE | Freq: Three times a day (TID) | ORAL | Status: DC
  Administered 2014-07-15 – 2014-07-16 (×3): 600 mg via ORAL
  Filled 2014-07-15 (×6): qty 2

## 2014-07-15 MED ORDER — ALBUTEROL SULFATE (2.5 MG/3ML) 0.083% IN NEBU
2.5000 mg | INHALATION_SOLUTION | Freq: Four times a day (QID) | RESPIRATORY_TRACT | Status: DC | PRN
Start: 2014-07-15 — End: 2014-07-16

## 2014-07-15 NOTE — Progress Notes (Signed)
[facility]    Subjective: Feels tired   Antibiotics:  Anti-infectives    Start     Dose/Rate Route Frequency Ordered Stop   07/15/14 1730  clindamycin (CLEOCIN) capsule 600 mg     600 mg Oral 3 times per day 07/15/14 1715     07/15/14 1700  emtricitabine-tenofovir (TRUVADA) 200-300 MG per tablet 1 tablet     1 tablet Oral Daily with supper 07/14/14 1734     07/14/14 2200  clindamycin (CLEOCIN) IVPB 900 mg  Status:  Discontinued     900 mg100 mL/hr over 30 Minutes Intravenous 3 times per day 07/14/14 1913 07/15/14 1715   07/14/14 2000  primaquine tablet 30 mg     30 mg Oral Daily 07/14/14 1913     07/13/14 1400  cefTRIAXone (ROCEPHIN) 1 g in dextrose 5 % 50 mL IVPB - Premix     1 g100 mL/hr over 30 Minutes Intravenous Every 24 hours 07/13/14 0006 07/20/14 0559   07/13/14 1000  azithromycin (ZITHROMAX) tablet 500 mg  Status:  Discontinued     500 mg Oral Every 24 hours 07/13/14 0006 07/15/14 1715   07/13/14 0200  sulfamethoxazole-trimethoprim (BACTRIM) 320 mg in dextrose 5 % 500 mL IVPB  Status:  Discontinued     320 mg346.7 mL/hr over 90 Minutes Intravenous Every 6 hours 07/13/14 0050 07/14/14 1912   07/13/14 0100  ritonavir (NORVIR) tablet 100 mg     100 mg Oral Daily with supper 07/13/14 0057     07/13/14 0045  atazanavir (REYATAZ) capsule 300 mg     300 mg Oral Daily with supper 07/13/14 0006     07/13/14 0045  emtricitabine-tenofovir (TRUVADA) 200-300 MG per tablet 1 tablet  Status:  Discontinued     1 tablet Oral Daily at bedtime 07/13/14 0006 07/14/14 1734   07/13/14 0045  ritonavir (NORVIR) capsule 100 mg  Status:  Discontinued     100 mg Oral Daily with supper 07/13/14 0006 07/13/14 0057      Medications: Scheduled Meds: . atazanavir  300 mg Oral Q supper  . cefTRIAXone (ROCEPHIN)  IV  1 g Intravenous Q24H  . clindamycin  600 mg Oral 3 times per day  . emtricitabine-tenofovir  1 tablet Oral Q supper  . enoxaparin (LOVENOX) injection  40  mg Subcutaneous Q24H  . famotidine  20 mg Oral Daily  . gabapentin  400 mg Oral QID  . nicotine  21 mg Transdermal Daily  . primaquine  30 mg Oral Daily  . QUEtiapine  600 mg Oral QHS  . ritonavir  100 mg Oral Q supper   Continuous Infusions:  PRN Meds:.alum & mag hydroxide-simeth, morphine injection, oxyCODONE    Objective: Weight change:   Intake/Output Summary (Last 24 hours) at 07/15/14 1716 Last data filed at 07/15/14 1403  Gross per 24 hour  Intake    620 ml  Output      0 ml  Net    620 ml   Blood pressure 109/67, pulse 86, temperature 98.4 F (36.9 C), temperature source Oral, resp. rate 18, height  (1.626 m), weight 197 lb 9.6 oz (89.631 kg), last menstrual period 07/08/2014, SpO2 98 %. Temp:  [97.6 F (36.4 C)-98.6 F (37 C)] 98.4 F (36.9 C) (01/14 1252) Pulse Rate:  [77-86] 86 (01/14 1252) Resp:  [18] 18 (01/14 1252) BP: (98-124)/(49-67) 109/67 mmHg (01/14 1252) SpO2:  [97 %-99 %] 98 % (01/14 1252)  Physical Exam: HEENT:  anicteric sclera, pupils reactive to light and accommodation, EOMI, oropharynx clear and without exudate CVS regular rate, normal r, no murmur rubs or gallops Chest: Diminished breath as of the bases with a few expiratory wheezes  Abdomen: soft nontender, nondistended, normal bowel sounds, Extremities: no clubbing or edema noted bilaterally Skin: Tattoos Neuro: nonfocal, strength and sensation intact  CBC:  CBC Latest Ref Rng 07/15/2014 07/14/2014 07/13/2014  WBC 4.0 - 10.5 K/uL 2.0(L) 1.9(L) 1.2(LL)  Hemoglobin 12.0 - 15.0 g/dL 1.6(X) 0.9(U) 0.4([place])  Hematocrit 36.0 - 46.0 % 28.7(L) 26.8(L) 28.2(L)  Platelets 150 - 400 K/uL 194 154 134(L)      BMET  Recent Labs  07/14/14 0528 07/15/14 0725  NA 135 140  K 4.2 4.5  CL 113* 108  CO2 14* 18*  GLUCOSE 137* 87  BUN 24* 20  CREATININE 1.06 1.03  CALCIUM 8.4 8.8     Liver Panel   Recent Labs  07/13/14 0103  PROT 8.0  ALBUMIN 3.2*  AST 30  ALT 19  ALKPHOS 105    BILITOT 0.7       Sedimentation Rate No results for input(s): ESRSEDRATE in the last 72 hours. C-Reactive Protein No results for input(s): CRP in the last 72 hours.  Micro Results: Recent Results (from the past 720 hour(s))  Culture, sputum-assessment     Status: None   Collection Time: 07/14/14  2:24 PM  Result Value Ref Range Status   Specimen Description SPUTUM  Final   Special Requests Immunocompromised  Final   Sputum evaluation   Final    THIS SPECIMEN IS ACCEPTABLE. RESPIRATORY CULTURE REPORT TO FOLLOW.   Report Status 07/14/2014 FINAL  Final  Pneumocystis smear by DFA     Status: None   Collection Time: 07/14/14  2:24 PM  Result Value Ref Range Status   Specimen Source-PJSRC SPUTUM  Final   Pneumocystis jiroveci Ag NEGATIVE  Final    Comment: Performed at [facility]  Culture, respiratory (NON-Expectorated)     Status: None (Preliminary result)   Collection Time: 07/14/14  2:24 PM  Result Value Ref Range Status   Specimen Description SPUTUM  Final   Special Requests NONE  Final   Gram Stain   Final    FEW WBC PRESENT, PREDOMINANTLY MONONUCLEAR RARE SQUAMOUS EPITHELIAL CELLS PRESENT RARE YEAST Performed at [facility]    Culture PENDING  Incomplete   Report Status PENDING  Incomplete    Studies/Results: Ct Abdomen Pelvis Wo Contrast  07/13/2014   CLINICAL DATA:  Upper and central abdominal pain for 4 days.  EXAM: CT ABDOMEN AND PELVIS WITHOUT CONTRAST  TECHNIQUE: Multidetector CT imaging of the abdomen and pelvis was performed following the standard protocol without IV contrast.  COMPARISON:  Renal ultrasound earlier the same day. Chest radiograph earlier the same day. No prior CT.  FINDINGS: There diffuse ground-glass and tree in bud opacities in the right and left lower lobes, right middle lobe, and lingula. The heart is mildly prominent in size.  Clips in the gallbladder fossa from prior cholecystectomy. Unenhanced appearance of the  liver is unremarkable. No evidence of biliary dilatation.  The spleen is enlarged measuring 13.2 x 6.0 x 10.4 cm. A splenule is noted at the anterior spleen. The enhanced pancreas and adrenal glands are normal. The kidneys are symmetric in size without hydronephrosis. Punctate 1-2 mm calcification is seen in the lower right kidney. No focal renal lesion. Both ureters are decompressed.  There are no  dilated or thickened bowel loops. The appendix is contrast filled and normal. Moderate volume of colonic stool. No free air, free fluid, or intra-abdominal fluid collection.  The abdominal aorta is normal in caliber. There is no retroperitoneal adenopathy. There is no mesenteric adenopathy.  Within the pelvis, the urinary bladder is physiologically distended. Air is in the bladder may be related to recent catheter placement. There is no bladder wall thickening.  There are no osseous abnormalities. The uterus is normal. There is a 3.9 cm right ovarian cyst. No definite pelvic free fluid. There is no pelvic adenopathy.  IMPRESSION: 1. No definite acute abnormality in the abdomen/pelvis. 2. Ground-glass and tree-in-bud opacities in the included lung bases, likely bronchiolitis/pneumonia. 3. The spleen is enlarged measuring 13 cm. 4. Punctate nonobstructing right renal stone. 5. Right ovarian cyst measures 3.9 cm.   Electronically Signed   By: Rubye OaksMelanie  Ehinger M.D.   On: 07/13/2014 19:45      Assessment/Plan:  Principal Problem:   CAP (community acquired pneumonia) Active Problems:   HIV (human immunodeficiency virus infection)   Pneumonia   Acute renal failure   Abdominal pain, epigastric   AKI (acute kidney injury)   PCP (pneumocystis carinii pneumonia)   Streptococcus pneumoniae pneumonia   RTA (renal tubular acidosis)    Sherry Strickland is a 34 y.o. female with  HIV, admitted with CAP found to have pneumococcal ag positive in urine, but also being rx for PCP also with possible RTA  #1 CAP +/- PCP:  Strep pneumo ag is in some reviews to carry a reasonablly high specificity. I agree however that we remain with suspicion for PCP (despite negative PCP smear on bactrim)  --I am changing her PCP rx to oral clinda and primaquine and would give her a 21 day course  --while she is NOT hypoxemic and therefore does not necessarily require steroids from PCP standpoint she has some wheezeson my exam and  Therefore I actually think adding oral prednisone taper to her regimen worth considering  --dc azithromycin daily dose, and continue rocephin for now  At dc she can be consolidated to oral clindamycin (which also has activity vs STrep PNA) and primaquine for her PCP       #2 Possible: RTA, improved acidosis off bactrim, would consider change to dapsone for PCP prophylaxis when done with her 21 day course of clinda/primarquine  #3 HIV: continue Reyataz Norvir Truvada follow-up CD4 count.  [place] in [place], she would also be welcome at our Clinic in [place] if she desired to change MDs' (she wants to keep her MD in [place])   LOS: 3 days   Acey LavCornelius [place] 07/15/2014, 5:16 PM

## 2014-07-15 NOTE — Progress Notes (Signed)
PROGRESS NOTE  Sherry Strickland V ZOX:[id number] DOB: 1981/02/06 DOA: 07/12/2014 PCP: No PCP Per Patient  HPI/Subjective: 34 y/o female with hx of HIV, off of medications for the past year due to concerns over renal failure. Presented with a 4 day hx of SOB, productive cough, and fevers with chills.  The patient was at [facility] and was transferred to [facility] for ID consult.  Chest x-ray in the ED showed bilateral lower lobe infiltrates most consistent with pneumonia.  Pt decided to start taking her antiretroviral regiman 3 weeks ago: Norvir, Truvada, and Reyataz.    Hx of cholecystectomy and pancreatitis from alcohol use 2 months ago.  Assessment/Plan:  Pneumonia: Not sure if Bacterial CAP vs PCP (given hx of HIV)-although Urine Strep Antigen positive Not hypoxic, not toxic appearing. No fever, but leukopenic.   Continue empiric Rocephin/Zithromax and started Clindamycin.  Bactrim has been discontinued due to possible RTA.  Appreciate ID antibiotic recommendations.    I have called Dr. Luiz Ochoaraig Coleman's office Phone: [phone] and requested records.  I have also put in a request for the RN to obtain these records.  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  CT Abd on 1/12 negative, Lipase within normal limits as well, belly is soft-with some mild epigastric tenderness.  Patient reports she is able to eat, she is having bowel movements.  She denies nausea and vomiting.  Treating with supportive care.  ARF: Secondary to pre-renal Azotemia.  Resolved with IVF.  Bactrim discontinued.  Pancytopenia: Secondary to HIV/AID's.  Follow CBC periodcially  Non Anion Gap Acidosis: ?etiology-?from ARF-bicarb lagging behind renal improvement.  Lactic acid not elevated.  Bicarb improved today with IVF.  Will follow bmet in am.  Tobacco Abuse: Counseled.  Continue transdermal nicotine  DVT Prophylaxis:   Lovenox  Code Status: Full Family Communication: no family at bedside Disposition Plan: home when medically stable.  Have requested ID follow up appt to be scheduled with [facility] ID clinic in [place],   Consultants:  ID  Procedures:  None  Antibiotics: Anti-infectives    Start     Dose/Rate Route Frequency Ordered Stop   07/15/14 1700  emtricitabine-tenofovir (TRUVADA) 200-300 MG per tablet 1 tablet     1 tablet Oral Daily with supper 07/14/14 1734     07/14/14 2200  clindamycin (CLEOCIN) IVPB 900 mg     900 mg100 mL/hr over 30 Minutes Intravenous 3 times per day 07/14/14 1913     07/14/14 2000  primaquine tablet 30 mg     30 mg Oral Daily 07/14/14 1913     07/13/14 1400  cefTRIAXone (ROCEPHIN) 1 g in dextrose 5 % 50 mL IVPB - Premix     1 g100 mL/hr over 30 Minutes Intravenous Every 24 hours 07/13/14 0006 07/20/14 0559   07/13/14 1000  azithromycin (ZITHROMAX) tablet 500 mg     500 mg Oral Every 24 hours 07/13/14 0006 07/20/14 0959   07/13/14 0200  sulfamethoxazole-trimethoprim (BACTRIM) 320 mg in dextrose 5 % 500 mL IVPB  Status:  Discontinued     320 mg346.7 mL/hr over 90 Minutes Intravenous Every 6 hours 07/13/14 0050 07/14/14 1912   07/13/14 0100  ritonavir (NORVIR) tablet 100 mg     100 mg Oral Daily with supper 07/13/14 0057     07/13/14 0045  atazanavir (REYATAZ) capsule 300 mg     300 mg Oral Daily with supper 07/13/14 0006  07/13/14 0045  emtricitabine-tenofovir (TRUVADA) 200-300 MG per tablet 1 tablet  Status:  Discontinued     1 tablet Oral Daily at bedtime 07/13/14 0006 07/14/14 1734   07/13/14 0045  ritonavir (NORVIR) capsule 100 mg  Status:  Discontinued     100 mg Oral Daily with supper 07/13/14 0006 07/13/14 0057       Objective: Filed Vitals:   07/14/14 2152 07/14/14 2152 07/15/14 0525 07/15/14 1252  BP: 124/66  98/49 109/67  Pulse: 77 80 77 86  Temp: 98.6 F (37 C)  97.6 F (36.4 C) 98.4 F (36.9 C)  TempSrc: Oral  Oral Oral  Resp: Height:      Weight:      SpO2: 99% 98% 97% 98%    Intake/Output Summary (Last 24 hours) at 07/15/14 1347 Last data filed at 07/15/14 0900  Gross per 24 hour  Intake   2180 ml  Output      0 ml  Net   2180 ml   Filed Weights   07/12/14 2300  Weight: 89.631 kg (197 lb 9.6 oz)    Exam: General: Well developed, well nourished, NAD, appears stated age.  Ambulating about the room.  Appears fatigued.  HEENT: No icterus, MMM  Cardiovascular: RRR, S1 S2 auscultated, no rubs, murmurs or gallops.   Respiratory: Clear to auscultation bilaterally with equal chest rise  Abdomen: Soft, mildly tender in epigastric region, nondistended, + bowel sounds. No rebound or rigidity Extremities: warm dry without cyanosis clubbing or edema.  Neuro: AAOx3. Strength 5/5 in lower extremities  Skin: Without rashes exudates or nodules.   Psych: Normal affect and demeanor with intact judgement and insight   Data Reviewed: Basic Metabolic Panel:  Recent Labs Lab 07/13/14 0103 07/14/14 0528 07/15/14 0725  NA 141 135 140  K 3.8 4.2 4.5  CL 113* 113* 108  CO2 19 14* 18*  GLUCOSE 137* 137* 87  BUN 33* 24* 20  CREATININE 1.49* 1.06 1.03  CALCIUM 8.5 8.4 8.8  PHOS  --   --  2.9   Liver Function Tests:  Recent Labs Lab 07/13/14 0103  AST 30  ALT 19  ALKPHOS 105  BILITOT 0.7  PROT 8.0  ALBUMIN 3.2*    Recent Labs Lab 07/13/14 0103  LIPASE 24   CBC:  Recent Labs Lab 07/13/14 0103 07/14/14 0528 07/15/14 0725  WBC 1.2* 1.9* 2.0*  NEUTROABS 0.7*  --   --   HGB 9.6* 9.2* 9.7*  HCT 28.2* 26.8* 28.7*  MCV 102.5* 103.5* 104.0*  PLT 134* 154 194   Cardiac Enzymes:  Recent Labs Lab 07/13/14 0103  TROPONINI <0.03     Recent Results (from the past 240 hour(s))  Culture, sputum-assessment     Status: None   Collection Time: 07/14/14  2:24 PM  Result Value Ref Range Status   Specimen Description SPUTUM  Final   Special Requests Immunocompromised  Final   Sputum evaluation    Final    THIS SPECIMEN IS ACCEPTABLE. RESPIRATORY CULTURE REPORT TO FOLLOW.   Report Status 07/14/2014 FINAL  Final  Pneumocystis smear by DFA     Status: None   Collection Time: 07/14/14  2:24 PM  Result Value Ref Range Status   Specimen Source-PJSRC SPUTUM  Final   Pneumocystis jiroveci Ag NEGATIVE  Final    Comment: Performed at [facility]  Culture, respiratory (NON-Expectorated)     Status: None (Preliminary result)  Collection Time: 07/14/14  2:24 PM  Result Value Ref Range Status   Specimen Description SPUTUM  Final   Special Requests NONE  Final   Gram Stain   Final    FEW WBC PRESENT, PREDOMINANTLY MONONUCLEAR RARE SQUAMOUS EPITHELIAL CELLS PRESENT RARE YEAST Performed at [facility]    Culture PENDING  Incomplete   Report Status PENDING  Incomplete     Studies: Ct Abdomen Pelvis Wo Contrast  07/13/2014   CLINICAL DATA:  Upper and central abdominal pain for 4 days.  EXAM: CT ABDOMEN AND PELVIS WITHOUT CONTRAST  TECHNIQUE: Multidetector CT imaging of the abdomen and pelvis was performed following the standard protocol without IV contrast.  COMPARISON:  Renal ultrasound earlier the same day. Chest radiograph earlier the same day. No prior CT.  FINDINGS: There diffuse ground-glass and tree in bud opacities in the right and left lower lobes, right middle lobe, and lingula. The heart is mildly prominent in size.  Clips in the gallbladder fossa from prior cholecystectomy. Unenhanced appearance of the liver is unremarkable. No evidence of biliary dilatation.  The spleen is enlarged measuring 13.2 x 6.0 x 10.4 cm. A splenule is noted at the anterior spleen. The enhanced pancreas and adrenal glands are normal. The kidneys are symmetric in size without hydronephrosis. Punctate 1-2 mm calcification is seen in the lower right kidney. No focal renal lesion. Both ureters are decompressed.  There are no dilated or thickened bowel loops. The appendix is contrast filled  and normal. Moderate volume of colonic stool. No free air, free fluid, or intra-abdominal fluid collection.  The abdominal aorta is normal in caliber. There is no retroperitoneal adenopathy. There is no mesenteric adenopathy.  Within the pelvis, the urinary bladder is physiologically distended. Air is in the bladder may be related to recent catheter placement. There is no bladder wall thickening.  There are no osseous abnormalities. The uterus is normal. There is a 3.9 cm right ovarian cyst. No definite pelvic free fluid. There is no pelvic adenopathy.  IMPRESSION: 1. No definite acute abnormality in the abdomen/pelvis. 2. Ground-glass and tree-in-bud opacities in the included lung bases, likely bronchiolitis/pneumonia. 3. The spleen is enlarged measuring 13 cm. 4. Punctate nonobstructing right renal stone. 5. Right ovarian cyst measures 3.9 cm.   Electronically Signed   By: Rubye Oaks M.D.   On: 07/13/2014 19:45   US Renal  07/13/2014   CLINICAL DATA:  Acute kidney injury.  HIV.  EXAM: RENAL/URINARY TRACT ULTRASOUND COMPLETE  COMPARISON:  None  FINDINGS: Right Kidney:  Length: 12.3 cm. Echogenicity within normal limits. No mass or hydronephrosis visualized.  Left Kidney:  Length: 12.0 cm. Echogenicity within normal limits. No mass or hydronephrosis visualized.  Bladder:  Appears normal for degree of bladder distention.  IMPRESSION: Normal sonographic appearance of both kidneys. No evidence of hydronephrosis.   Electronically Signed   By: Myles Rosenthal M.D.   On: 07/13/2014 15:27    Scheduled Meds: . atazanavir  300 mg Oral Q supper  . azithromycin  500 mg Oral Q24H  . cefTRIAXone (ROCEPHIN)  IV  1 g Intravenous Q24H  . clindamycin (CLEOCIN) IV  900 mg Intravenous 3 times per day  . emtricitabine-tenofovir  1 tablet Oral Q supper  . enoxaparin (LOVENOX) injection  40 mg Subcutaneous Q24H  . famotidine  20 mg Oral Daily  . gabapentin  400 mg Oral QID  . nicotine  21 mg Transdermal Daily  .  primaquine  30 mg Oral Daily  .  QUEtiapine  600 mg Oral QHS  . ritonavir  100 mg Oral Q supper   Continuous Infusions:   Principal Problem:   CAP (community acquired pneumonia) Active Problems:   HIV (human immunodeficiency virus infection)   Pneumonia   Acute renal failure   Abdominal pain, epigastric   AKI (acute kidney injury)   PCP (pneumocystis carinii pneumonia)   Streptococcus pneumoniae pneumonia   RTA (renal tubular acidosis)  Algis Downs, PA-C  [facility] Pager [phone]. If 7PM-7AM, please contact night-coverage at [email or web address], password [facility] 07/15/2014, 1:47 PM  LOS: 3 days

## 2014-07-15 NOTE — Care Management Note (Unsigned)
    Page 1 of 1   07/15/2014     4:59:35 PM CARE MANAGEMENT NOTE 07/15/2014  Patient:  Sherry Strickland,Sherry Strickland   Account Number:  [id number]  Date Initiated:  07/15/2014  Documentation initiated by:  Letha CapeAYLOR,Amani Nodarse  Subjective/Objective Assessment:   dx pna  admit-from home     Action/Plan:   Anticipated DC Date:  07/16/2014   Anticipated DC Plan:  HOME/SELF CARE      DC Planning Services  CM consult      Choice offered to / List presented to:             Status of service:  In process, will continue to follow Medicare Important Message given?  NO (If response is "NO", the following Medicare IM given date fields will be blank) Date Medicare IM given:   Medicare IM given by:   Date Additional Medicare IM given:   Additional Medicare IM given by:    Discharge Disposition:    Per UR Regulation:  Reviewed for med. necessity/level of care/duration of stay  If discussed at Long Length of Stay Meetings, dates discussed:    Comments:  07/15/14 1658 Letha Capeeborah Jalan Bodi RN, BSN [phone]  NCM called ID clinic in [place] at [phone]  fax [phone], they closed at 4:30 , will try to call tomorow.

## 2014-07-16 DIAGNOSIS — N2589 Other disorders resulting from impaired renal tubular function: Secondary | ICD-10-CM

## 2014-07-16 LAB — BASIC METABOLIC PANEL
Anion gap: 11 (ref 5–15)
BUN: 23 mg/dL (ref 6–23)
CHLORIDE: 104 meq/L (ref 96–112)
CO2: 19 mmol/L (ref 19–32)
CREATININE: 1.02 mg/dL (ref 0.50–1.10)
Calcium: 8.7 mg/dL (ref 8.4–10.5)
GFR calc Af Amer: 82 mL/min — ABNORMAL LOW (ref >90)
GFR, EST NON AFRICAN AMERICAN: 71 mL/min — AB (ref >90)
Glucose, Bld: 88 mg/dL (ref 70–99)
Potassium: 4.6 mmol/L (ref 3.5–5.1)
SODIUM: 134 mmol/L — AB (ref 135–145)

## 2014-07-16 LAB — CBC
HCT: 32.3 % — ABNORMAL LOW (ref 36.0–46.0)
Hemoglobin: 10.8 g/dL — ABNORMAL LOW (ref 12.0–15.0)
MCH: 34.7 pg — ABNORMAL HIGH (ref 26.0–34.0)
MCHC: 33.4 g/dL (ref 30.0–36.0)
MCV: 103.9 fL — ABNORMAL HIGH (ref 78.0–100.0)
PLATELETS: 212 10*3/uL (ref 150–400)
RBC: 3.11 MIL/uL — AB (ref 3.87–5.11)
RDW: 13.8 % (ref 11.5–15.5)
WBC: 2.2 10*3/uL — ABNORMAL LOW (ref 4.0–10.5)

## 2014-07-16 LAB — GLUCOSE 6 PHOSPHATE DEHYDROGENASE: G6PDH: 12.3 U/g Hgb (ref 7.0–20.5)

## 2014-07-16 MED ORDER — PRIMAQUINE PHOSPHATE 26.3 MG PO TABS: 30.0000 mg | ORAL_TABLET | Freq: Every day | ORAL | Status: AC

## 2014-07-16 MED ORDER — FLUTICASONE PROPIONATE 50 MCG/ACT NA SUSP: 2.0000 | Freq: Every day | NASAL | Status: AC

## 2014-07-16 MED ORDER — NICOTINE 21 MG/24HR TD PT24: 21.0000 mg | MEDICATED_PATCH | Freq: Every day | TRANSDERMAL | Status: AC

## 2014-07-16 MED ORDER — PREDNISONE 10 MG PO TABS: ORAL_TABLET | ORAL | Status: DC

## 2014-07-16 MED ORDER — QUETIAPINE FUMARATE 300 MG PO TABS: 600.0000 mg | ORAL_TABLET | Freq: Every day | ORAL | Status: AC

## 2014-07-16 MED ORDER — DAPSONE 100 MG PO TABS: ORAL_TABLET | ORAL | Status: AC

## 2014-07-16 MED ORDER — CLINDAMYCIN HCL 300 MG PO CAPS: 600.0000 mg | ORAL_CAPSULE | Freq: Three times a day (TID) | ORAL | Status: AC

## 2014-07-16 MED ORDER — OXYCODONE HCL 5 MG PO TABS: 5.0000 mg | ORAL_TABLET | Freq: Four times a day (QID) | ORAL | Status: AC | PRN

## 2014-07-16 MED ORDER — GABAPENTIN 400 MG PO CAPS: 400.0000 mg | ORAL_CAPSULE | Freq: Four times a day (QID) | ORAL | Status: AC

## 2014-07-16 NOTE — Progress Notes (Signed)
RT called to pts bedside for PRN neb. Pts BBS Clr sats 93. Pt in no distress. Pt eating at this time

## 2014-07-16 NOTE — Progress Notes (Signed)
NURSING PROGRESS NOTE  Sherry DuraCrystal Strickland Strickland [id number] Discharge Data: 07/16/2014 1:03 PM Attending Provider: Maretta BeesShanker M Ghimire, MD PCP:No PCP Per Patient     Sherry Strickland to be D/C'd Home per MD order.  Discussed with the patient the After Visit Summary and all questions fully answered. All IV's discontinued with no bleeding noted. All belongings returned to patient for patient to take home.   Last Vital Signs:  Blood pressure 106/68, pulse 113, temperature 98.9 F (37.2 C), temperature source Oral, resp. rate 16, height 5\' 4"  (1.626 m), weight 89.631 kg (197 lb 9.6 oz), last menstrual period 07/08/2014, SpO2 93 %.  Discharge Medication List   Medication List    STOP taking these medications        PRESCRIPTION MEDICATION      TAKE these medications        atazanavir 150 MG capsule  Commonly known as:  REYATAZ  Take 300 mg by mouth at bedtime.     clindamycin 300 MG capsule  Commonly known as:  CLEOCIN  Take 2 capsules (600 mg total) by mouth every 8 (eight) hours.     dapsone 100 MG tablet  Take one tablet daily after you have finished primaquine and clindamycin.     emtricitabine-tenofovir 200-300 MG per tablet  Commonly known as:  TRUVADA  Take 1 tablet by mouth at bedtime.     fluticasone 50 MCG/ACT nasal spray  Commonly known as:  FLONASE  Place 2 sprays into both nostrils daily.     gabapentin 400 MG capsule  Commonly known as:  NEURONTIN  Take 1 capsule (400 mg total) by mouth 4 (four) times daily.     nicotine 21 mg/24hr patch  Commonly known as:  NICODERM CQ - dosed in mg/24 hours  Place 1 patch (21 mg total) onto the skin daily.     oxyCODONE 5 MG immediate release tablet  Commonly known as:  Oxy IR/ROXICODONE  Take 1 tablet (5 mg total) by mouth every 6 (six) hours as needed for moderate pain.     predniSONE 10 MG tablet  Commonly known as:  DELTASONE  Take 5 tab on day 1 with breakfast, then 4 tabs on day 2, 3 tabs on day 3, 2 tabs on day 4, and 1  tab on day 5     primaquine 26.3 MG tablet  Take 2 tablets (30 mg total) by mouth daily.     QUEtiapine 300 MG tablet  Commonly known as:  SEROQUEL  Take 2 tablets (600 mg total) by mouth at bedtime.     ritonavir 100 MG capsule  Commonly known as:  NORVIR  Take 100 mg by mouth at bedtime.         Sherry Parsonsattha Danyelle Brookover, RN

## 2014-07-16 NOTE — Discharge Summary (Signed)
Physician Discharge Summary  [place] Strickland EAV:[id number] DOB: 1980-11-17 DOA: 07/12/2014  PCP: No PCP Per Patient  Admit date: 07/12/2014 Discharge date: 07/16/2014  Time spent: 35 minutes  Recommendations for Outpatient Follow-up:  1. Follow up with ID in 1-2 weeks.  Please check BMET.CBC   Discharge Diagnoses:  Principal Problem:   CAP (community acquired pneumonia) Active Problems:   HIV (human immunodeficiency virus infection)   Pneumonia   Acute renal failure   Abdominal pain, epigastric   AKI (acute kidney injury)   PCP (pneumocystis carinii pneumonia)   Streptococcus pneumoniae pneumonia   RTA (renal tubular acidosis)   Discharge Condition: stable.  Diet recommendation: heart healthy  Filed Weights   07/12/14 2300  Weight: 89.631 kg (197 lb 9.6 oz)    History of present illness:  34 y/o female with hx of HIV, off of medications for the past year due to concerns over renal failure. Presented with a 4 day hx of SOB, productive cough, and fevers with chills. The patient was at [facility] and was transferred to [facility] for ID consult. Chest x-ray in the ED showed bilateral lower lobe infiltrates most consistent with pneumonia. Pt restarted her antiretroviral regiman 3 weeks ago: Norvir, Truvada, and Reyataz. Hx of cholecystectomy and pancreatitis from alcohol use 2 months ago.  Hospital Course:  Pneumonia: Not sure if Bacterial CAP vs PCP.   Urine Strep Antigen positive.  Influenza PCR negative. Not hypoxic, not toxic appearing. No fever, but leukopenic.  She was treated with Rocephin/Zithromax/Bactrim initially.  However she developed acidosis thought to be renal tubule acidosis, so the bactrim was stopped and clindamycin was added.  On 1/14 she was changed to primaquine / clindamycin for a 21 day course.  ID recommends that Ms. Sherry Strickland begin Dapsone for PCP prophylaxis when her course of primaquine/clinda is complete.   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 CT Abd on 1/12 negative, Lipase within normal limits as well, belly is soft-with some mild epigastric tenderness. Patient reports she is able to eat, she is having bowel movements. She denies nausea and vomiting. Treated with supportive care.    ARF: Secondary to pre-renal Azotemia. Resolved with IVF. Bactrim discontinued.  Pancytopenia: Secondary to HIV/AID's. Follow CBC periodically outpatient.  Non Anion Gap Acidosis: Uncertain etiology.  Likely from  ARF-bicarb lagging behind renal improvement. Lactic acid not elevated. Bicarb improved with IVF. CO2 is within normal limits on d/c.  Tobacco Abuse: Counseled. Prescribed transdermal nicotine  Procedures: none  Consultations:  Infectious Disease, Dr. Algis Strickland  Discharge Exam: Filed Vitals:   07/15/14 1252 07/15/14 2219 07/16/14 0554 07/16/14 0901  BP: 109/67 112/63 106/68   Pulse: 86 91 112 113  Temp: 98.4 F (36.9 C) 97.7 F (36.5 C) 98.9 F (37.2 C)   TempSrc: Oral Oral Oral   Resp: Height:      Weight:      SpO2: 98% 97% 97% 93%    General: Well developed, well nourished, NAD, appears stated age.  HEENT: No icterus, MMM  Cardiovascular: RRR, S1 S2 auscultated, no rubs, murmurs or gallops.  Respiratory: Clear to auscultation bilaterally with equal chest rise  Abdomen: Soft, mildly tender in epigastric region, nondistended, + bowel sounds. No rebound or rigidity Extremities: warm dry without cyanosis clubbing or edema.  Neuro: AAOx3. Strength 5/5 in lower extremities  Skin: Without rashes exudates or nodules. multiple tattoos Psych: Normal affect  and demeanor with intact judgement and insight   Discharge Instructions   Discharge Instructions    Diet - low sodium heart healthy    Complete by:  As directed      Increase activity slowly    Complete by:  As directed            Current Discharge Medication List    START taking these medications   Details  clindamycin (CLEOCIN) 300 MG capsule Take 2 capsules (600 mg total) by mouth every 8 (eight) hours. Qty: 120 capsule, Refills: 0    dapsone 100 MG tablet Take one tablet daily after you have finished primaquine and clindamycin. Qty: 30 tablet, Refills: 3    fluticasone (FLONASE) 50 MCG/ACT nasal spray Place 2 sprays into both nostrils daily. Qty: 16 g, Refills: 2    nicotine (NICODERM CQ - DOSED IN MG/24 HOURS) 21 mg/24hr patch Place 1 patch (21 mg total) onto the skin daily. Qty: 28 patch, Refills: 0    oxyCODONE (OXY IR/ROXICODONE) 5 MG immediate release tablet Take 1 tablet (5 mg total) by mouth every 6 (six) hours as needed for moderate pain. Qty: 30 tablet, Refills: 0    predniSONE (DELTASONE) 10 MG tablet Take 5 tab on day 1 with breakfast, then 4 tabs on day 2, 3 tabs on day 3, 2 tabs on day 4, and 1 tab on day 5 Qty: 15 tablet, Refills: 0    primaquine 26.3 MG tablet Take 2 tablets (30 mg total) by mouth daily. Qty: 40 tablet, Refills: 0      CONTINUE these medications which have CHANGED   Details  gabapentin (NEURONTIN) 400 MG capsule Take 1 capsule (400 mg total) by mouth 4 (four) times daily. Qty: 120 capsule, Refills: 0    QUEtiapine (SEROQUEL) 300 MG tablet Take 2 tablets (600 mg total) by mouth at bedtime. Qty: 60 tablet, Refills: 0      CONTINUE these medications which have NOT CHANGED   Details  atazanavir (REYATAZ) 150 MG capsule Take 300 mg by mouth at bedtime.    emtricitabine-tenofovir (TRUVADA) 200-300 MG per tablet Take 1 tablet by mouth at bedtime.    ritonavir (NORVIR) 100 MG capsule Take 100 mg by mouth at bedtime.      STOP taking these medications     PRESCRIPTION MEDICATION        No Known Allergies Follow-up Information    Schedule an appointment as soon as possible for a visit in 1 week to follow up.   Why:  follow up with your Infectious Disease Doctor  in 2 weeks.  You will need blood work done.       The results of significant diagnostics from this hospitalization (including imaging, microbiology, ancillary and laboratory) are listed below for reference.    Significant Diagnostic Studies: Ct Abdomen Pelvis Wo Contrast  07/13/2014   CLINICAL DATA:  Upper and central abdominal pain for 4 days.  EXAM: CT ABDOMEN AND PELVIS WITHOUT CONTRAST  TECHNIQUE: Multidetector CT imaging of the abdomen and pelvis was performed following the standard protocol without IV contrast.  COMPARISON:  Renal ultrasound earlier the same day. Chest radiograph earlier the same day. No prior CT.  FINDINGS: There diffuse ground-glass and tree in bud opacities in the right and left lower lobes, right middle lobe, and lingula. The heart is mildly prominent in size.  Clips in the gallbladder fossa from prior cholecystectomy. Unenhanced appearance of the liver is unremarkable. No evidence of biliary dilatation.  The spleen is enlarged measuring 13.2 x 6.0 x 10.4 cm. A splenule is noted at the anterior spleen. The enhanced pancreas and adrenal glands are normal. The kidneys are symmetric in size without hydronephrosis. Punctate 1-2 mm calcification is seen in the lower right kidney. No focal renal lesion. Both ureters are decompressed.  There are no dilated or thickened bowel loops. The appendix is contrast filled and normal. Moderate volume of colonic stool. No free air, free fluid, or intra-abdominal fluid collection.  The abdominal aorta is normal in caliber. There is no retroperitoneal adenopathy. There is no mesenteric adenopathy.  Within the pelvis, the urinary bladder is physiologically distended. Air is in the bladder may be related to recent catheter placement. There is no bladder wall thickening.  There are no osseous abnormalities. The uterus is normal. There is a 3.9 cm right ovarian cyst. No definite pelvic free fluid. There is no pelvic adenopathy.  IMPRESSION: 1. No  definite acute abnormality in the abdomen/pelvis. 2. Ground-glass and tree-in-bud opacities in the included lung bases, likely bronchiolitis/pneumonia. 3. The spleen is enlarged measuring 13 cm. 4. Punctate nonobstructing right renal stone. 5. Right ovarian cyst measures 3.9 cm.   Electronically Signed   By: Rubye Oaks M.D.   On: 07/13/2014 19:45   US Renal  07/13/2014   CLINICAL DATA:  Acute kidney injury.  HIV.  EXAM: RENAL/URINARY TRACT ULTRASOUND COMPLETE  COMPARISON:  None  FINDINGS: Right Kidney:  Length: 12.3 cm. Echogenicity within normal limits. No mass or hydronephrosis visualized.  Left Kidney:  Length: 12.0 cm. Echogenicity within normal limits. No mass or hydronephrosis visualized.  Bladder:  Appears normal for degree of bladder distention.  IMPRESSION: Normal sonographic appearance of both kidneys. No evidence of hydronephrosis.   Electronically Signed   By: Myles Rosenthal M.D.   On: 07/13/2014 15:27   Dg Chest Port 1 View  07/13/2014   CLINICAL DATA:  Pneumonia.  Cough.  Shortness of breath.  EXAM: PORTABLE CHEST - 1 VIEW  COMPARISON:  07/12/2014.  FINDINGS: Mediastinum and hilar structures normal. Heart size is stable. Persistent bibasilar pulmonary infiltrates are noted most consistent with pneumonia. Pulmonary edema cannot be excluded. No pleural effusion or pneumothorax.  IMPRESSION: Persistent patchy bibasilar pulmonary infiltrates most consistent with pneumonia. Pulmonary edema cannot be excluded.   Electronically Signed   By: Maisie Fus  Register   On: 07/13/2014 07:55    Microbiology: Recent Results (from the past 240 hour(s))  Culture, sputum-assessment     Status: None   Collection Time: 07/14/14  2:24 PM  Result Value Ref Range Status   Specimen Description SPUTUM  Final   Special Requests Immunocompromised  Final   Sputum evaluation   Final    THIS SPECIMEN IS ACCEPTABLE. RESPIRATORY CULTURE REPORT TO FOLLOW.   Report Status 07/14/2014 FINAL  Final  Pneumocystis smear by  DFA     Status: None   Collection Time: 07/14/14  2:24 PM  Result Value Ref Range Status   Specimen Source-PJSRC SPUTUM  Final   Pneumocystis jiroveci Ag NEGATIVE  Final    Comment: Performed at [facility]  Culture, respiratory (NON-Expectorated)     Status: None (Preliminary result)   Collection Time: 07/14/14  2:24 PM  Result Value Ref Range Status   Specimen Description SPUTUM  Final   Special Requests NONE  Final   Gram Stain   Final    FEW WBC PRESENT, PREDOMINANTLY MONONUCLEAR RARE SQUAMOUS EPITHELIAL CELLS PRESENT RARE YEAST Performed  at [facility]    Culture   Final    FEW CANDIDA ALBICANS Performed at [facility]    Report Status PENDING  Incomplete     Labs:  Results for Sherry Strickland, Sherry Strickland (MRN [id number]) as of 07/16/2014 11:38  Ref. Range 07/13/2014 01:03 07/14/2014 10:22  Influenza A By PCR Latest Range: NEGATIVE   NEGATIVE  Influenza B By PCR Latest Range: NEGATIVE   NEGATIVE  H1N1 flu by pcr Latest Range: NOT DETECTED   NOT DETECTED  HIV 1 RNA Quant Latest Range: <20 copies/mL 24731 (H)   HIV1 RNA Quant, Log Latest Range: <1.30 log 10 4.39 (H)     Basic Metabolic Panel:  Recent Labs Lab 07/13/14 0103 07/14/14 0528 07/15/14 0725 07/16/14 0650  NA 141 135 140 134*  K 3.8 4.2 4.5 4.6  CL 113* 113* 108 104  CO2 19 14* 18* 19  GLUCOSE 137* 137* 87 88  BUN 33* 24* 20 23  CREATININE 1.49* 1.06 1.03 1.02  CALCIUM 8.5 8.4 8.8 8.7  PHOS  --   --  2.9  --    Liver Function Tests:  Recent Labs Lab 07/13/14 0103  AST 30  ALT 19  ALKPHOS 105  BILITOT 0.7  PROT 8.0  ALBUMIN 3.2*    Recent Labs Lab 07/13/14 0103  LIPASE 24   CBC:  Recent Labs Lab 07/13/14 0103 07/14/14 0528 07/15/14 0725 07/16/14 0650  WBC 1.2* 1.9* 2.0* 2.2*  NEUTROABS 0.7*  --   --   --   HGB 9.6* 9.2* 9.7* 10.8*  HCT 28.2* 26.8* 28.7* 32.3*  MCV 102.5* 103.5* 104.0* 103.9*  PLT 134* 154 194 212   Cardiac Enzymes:  Recent Labs Lab  07/13/14 0103  TROPONINI <0.03      Signed:  Stephani PoliceYork, Fedora Knisely L, PA-C  [facility] 07/16/2014, 12:05 PM

## 2014-07-17 LAB — CULTURE, RESPIRATORY W GRAM STAIN

## 2014-07-17 LAB — CULTURE, RESPIRATORY

## 2015-12-16 DIAGNOSIS — S37009A Unspecified injury of unspecified kidney, initial encounter: Secondary | ICD-10-CM | POA: Insufficient documentation

## 2015-12-16 DIAGNOSIS — J9601 Acute respiratory failure with hypoxia: Secondary | ICD-10-CM | POA: Insufficient documentation

## 2015-12-16 DIAGNOSIS — B20 Human immunodeficiency virus [HIV] disease: Secondary | ICD-10-CM | POA: Insufficient documentation

## 2015-12-17 ENCOUNTER — Inpatient Hospital Stay (HOSPITAL_COMMUNITY)
Admission: AD | Admit: 2015-12-17 | Discharge: 2015-12-31 | DRG: 917 | Disposition: E | Payer: Medicaid - Out of State | Source: Other Acute Inpatient Hospital | Attending: Pulmonary Disease | Admitting: Pulmonary Disease

## 2015-12-17 ENCOUNTER — Inpatient Hospital Stay (HOSPITAL_COMMUNITY): Payer: Medicaid - Out of State

## 2015-12-17 DIAGNOSIS — T503X5A Adverse effect of electrolytic, caloric and water-balance agents, initial encounter: Secondary | ICD-10-CM | POA: Diagnosis present

## 2015-12-17 DIAGNOSIS — R570 Cardiogenic shock: Secondary | ICD-10-CM | POA: Diagnosis not present

## 2015-12-17 DIAGNOSIS — J989 Respiratory disorder, unspecified: Secondary | ICD-10-CM

## 2015-12-17 DIAGNOSIS — Z9114 Patient's other noncompliance with medication regimen: Secondary | ICD-10-CM | POA: Diagnosis not present

## 2015-12-17 DIAGNOSIS — G936 Cerebral edema: Secondary | ICD-10-CM | POA: Diagnosis not present

## 2015-12-17 DIAGNOSIS — I609 Nontraumatic subarachnoid hemorrhage, unspecified: Secondary | ICD-10-CM | POA: Diagnosis present

## 2015-12-17 DIAGNOSIS — N179 Acute kidney failure, unspecified: Secondary | ICD-10-CM | POA: Diagnosis present

## 2015-12-17 DIAGNOSIS — G9340 Encephalopathy, unspecified: Secondary | ICD-10-CM | POA: Insufficient documentation

## 2015-12-17 DIAGNOSIS — G931 Anoxic brain damage, not elsewhere classified: Secondary | ICD-10-CM

## 2015-12-17 DIAGNOSIS — R34 Anuria and oliguria: Secondary | ICD-10-CM | POA: Diagnosis not present

## 2015-12-17 DIAGNOSIS — J690 Pneumonitis due to inhalation of food and vomit: Secondary | ICD-10-CM | POA: Diagnosis present

## 2015-12-17 DIAGNOSIS — Z6837 Body mass index (BMI) 37.0-37.9, adult: Secondary | ICD-10-CM | POA: Diagnosis not present

## 2015-12-17 DIAGNOSIS — R7989 Other specified abnormal findings of blood chemistry: Secondary | ICD-10-CM | POA: Diagnosis not present

## 2015-12-17 DIAGNOSIS — G253 Myoclonus: Secondary | ICD-10-CM | POA: Diagnosis present

## 2015-12-17 DIAGNOSIS — I469 Cardiac arrest, cause unspecified: Secondary | ICD-10-CM

## 2015-12-17 DIAGNOSIS — J189 Pneumonia, unspecified organism: Secondary | ICD-10-CM | POA: Diagnosis present

## 2015-12-17 DIAGNOSIS — F1510 Other stimulant abuse, uncomplicated: Secondary | ICD-10-CM | POA: Diagnosis present

## 2015-12-17 DIAGNOSIS — Z978 Presence of other specified devices: Secondary | ICD-10-CM

## 2015-12-17 DIAGNOSIS — D696 Thrombocytopenia, unspecified: Secondary | ICD-10-CM | POA: Diagnosis present

## 2015-12-17 DIAGNOSIS — Z79899 Other long term (current) drug therapy: Secondary | ICD-10-CM | POA: Diagnosis not present

## 2015-12-17 DIAGNOSIS — B20 Human immunodeficiency virus [HIV] disease: Secondary | ICD-10-CM | POA: Diagnosis present

## 2015-12-17 DIAGNOSIS — R569 Unspecified convulsions: Secondary | ICD-10-CM | POA: Diagnosis present

## 2015-12-17 DIAGNOSIS — E669 Obesity, unspecified: Secondary | ICD-10-CM | POA: Diagnosis present

## 2015-12-17 DIAGNOSIS — E874 Mixed disorder of acid-base balance: Secondary | ICD-10-CM | POA: Diagnosis present

## 2015-12-17 DIAGNOSIS — F17200 Nicotine dependence, unspecified, uncomplicated: Secondary | ICD-10-CM | POA: Diagnosis present

## 2015-12-17 DIAGNOSIS — T43621A Poisoning by amphetamines, accidental (unintentional), initial encounter: Secondary | ICD-10-CM | POA: Diagnosis present

## 2015-12-17 DIAGNOSIS — R739 Hyperglycemia, unspecified: Secondary | ICD-10-CM | POA: Diagnosis present

## 2015-12-17 DIAGNOSIS — I468 Cardiac arrest due to other underlying condition: Secondary | ICD-10-CM | POA: Insufficient documentation

## 2015-12-17 DIAGNOSIS — J80 Acute respiratory distress syndrome: Secondary | ICD-10-CM | POA: Diagnosis not present

## 2015-12-17 DIAGNOSIS — Z9289 Personal history of other medical treatment: Secondary | ICD-10-CM

## 2015-12-17 DIAGNOSIS — B1920 Unspecified viral hepatitis C without hepatic coma: Secondary | ICD-10-CM | POA: Diagnosis present

## 2015-12-17 DIAGNOSIS — J9601 Acute respiratory failure with hypoxia: Secondary | ICD-10-CM | POA: Diagnosis not present

## 2015-12-17 DIAGNOSIS — Z66 Do not resuscitate: Secondary | ICD-10-CM | POA: Diagnosis not present

## 2015-12-17 DIAGNOSIS — Z7951 Long term (current) use of inhaled steroids: Secondary | ICD-10-CM

## 2015-12-17 DIAGNOSIS — Z885 Allergy status to narcotic agent status: Secondary | ICD-10-CM

## 2015-12-17 DIAGNOSIS — J9600 Acute respiratory failure, unspecified whether with hypoxia or hypercapnia: Secondary | ICD-10-CM

## 2015-12-17 LAB — BLOOD GAS, ARTERIAL
ACID-BASE DEFICIT: 5.4 mmol/L — AB (ref 0.0–2.0)
Bicarbonate: 19.3 mEq/L — ABNORMAL LOW (ref 20.0–24.0)
DRAWN BY: 24513
FIO2: 1
MECHVT: 520 mL
O2 SAT: 75.1 %
PEEP/CPAP: 10 cmH2O
PH ART: 7.331 — AB (ref 7.350–7.450)
PO2 ART: 45.7 mmHg — AB (ref 80.0–100.0)
Patient temperature: 100.3
RATE: 18 resp/min
TCO2: 20.4 mmol/L (ref 0–100)
pCO2 arterial: 38 mmHg (ref 35.0–45.0)

## 2015-12-17 LAB — GLUCOSE, CAPILLARY: Glucose-Capillary: 187 mg/dL — ABNORMAL HIGH (ref 65–99)

## 2015-12-17 MED ORDER — PROPOFOL 1000 MG/100ML IV EMUL
INTRAVENOUS | Status: AC
  Filled 2015-12-17: qty 100

## 2015-12-17 MED ORDER — CHLORHEXIDINE GLUCONATE 0.12% ORAL RINSE (MEDLINE KIT)
15.0000 mL | Freq: Two times a day (BID) | OROMUCOSAL | Status: DC
  Administered 2015-12-17 – 2015-12-20 (×6): 15 mL via OROMUCOSAL

## 2015-12-17 MED ORDER — PROPOFOL 1000 MG/100ML IV EMUL
0.0000 ug/kg/min | INTRAVENOUS | Status: DC
  Administered 2015-12-17: 50 ug/kg/min via INTRAVENOUS
  Administered 2015-12-18 (×2): 45 ug/kg/min via INTRAVENOUS
  Filled 2015-12-17 (×2): qty 100

## 2015-12-17 MED ORDER — PIPERACILLIN-TAZOBACTAM 3.375 G IVPB
3.3750 g | Freq: Three times a day (TID) | INTRAVENOUS | Status: DC
  Administered 2015-12-18 – 2015-12-19 (×3): 3.375 g via INTRAVENOUS
  Filled 2015-12-17 (×5): qty 50

## 2015-12-17 MED ORDER — FENTANYL BOLUS VIA INFUSION
50.0000 ug | INTRAVENOUS | Status: DC | PRN
  Filled 2015-12-17: qty 50

## 2015-12-17 MED ORDER — NOREPINEPHRINE BITARTRATE 1 MG/ML IV SOLN
0.0000 ug/min | INTRAVENOUS | Status: DC
  Administered 2015-12-17: 25 ug/min via INTRAVENOUS
  Administered 2015-12-18: 19 ug/min via INTRAVENOUS
  Filled 2015-12-17 (×2): qty 16

## 2015-12-17 MED ORDER — SODIUM CHLORIDE 0.9 % IV SOLN
1250.0000 mg | INTRAVENOUS | Status: DC
  Administered 2015-12-18 – 2015-12-19 (×2): 1250 mg via INTRAVENOUS
  Filled 2015-12-17 (×2): qty 1250

## 2015-12-17 MED ORDER — INSULIN ASPART 100 UNIT/ML ~~LOC~~ SOLN
2.0000 [IU] | SUBCUTANEOUS | Status: DC
  Administered 2015-12-17: 2 [IU] via SUBCUTANEOUS
  Administered 2015-12-18 (×2): 4 [IU] via SUBCUTANEOUS
  Administered 2015-12-18: 2 [IU] via SUBCUTANEOUS
  Administered 2015-12-18 (×3): 4 [IU] via SUBCUTANEOUS

## 2015-12-17 MED ORDER — ANTISEPTIC ORAL RINSE SOLUTION (CORINZ)
7.0000 mL | Freq: Four times a day (QID) | OROMUCOSAL | Status: DC
  Administered 2015-12-18 – 2015-12-20 (×11): 7 mL via OROMUCOSAL

## 2015-12-17 MED ORDER — SODIUM CHLORIDE 0.9 % IV SOLN
25.0000 ug/h | INTRAVENOUS | Status: DC
  Administered 2015-12-17: 50 ug/h via INTRAVENOUS
  Administered 2015-12-18: 200 ug/h via INTRAVENOUS
  Filled 2015-12-17 (×2): qty 50

## 2015-12-17 MED ORDER — SULFAMETHOXAZOLE-TRIMETHOPRIM 400-80 MG/5ML IV SOLN
10.0000 mg/kg/d | Freq: Three times a day (TID) | INTRAVENOUS | Status: DC
  Administered 2015-12-18 (×2): 302.4 mg via INTRAVENOUS
  Filled 2015-12-17 (×3): qty 18.9

## 2015-12-17 MED ORDER — AMIODARONE HCL IN DEXTROSE 360-4.14 MG/200ML-% IV SOLN
30.0000 mg/h | INTRAVENOUS | Status: DC
  Administered 2015-12-18: 30 mg/h via INTRAVENOUS
  Filled 2015-12-17 (×5): qty 200

## 2015-12-17 MED ORDER — SODIUM CHLORIDE 0.9 % IV SOLN
750.0000 mg | Freq: Two times a day (BID) | INTRAVENOUS | Status: DC
  Administered 2015-12-17 – 2015-12-19 (×5): 750 mg via INTRAVENOUS
  Filled 2015-12-17 (×6): qty 7.5

## 2015-12-17 MED ORDER — FAMOTIDINE IN NACL 20-0.9 MG/50ML-% IV SOLN
20.0000 mg | Freq: Two times a day (BID) | INTRAVENOUS | Status: DC
  Administered 2015-12-17 – 2015-12-19 (×4): 20 mg via INTRAVENOUS
  Filled 2015-12-17 (×5): qty 50

## 2015-12-17 MED ORDER — HEPARIN (PORCINE) IN NACL 100-0.45 UNIT/ML-% IJ SOLN
1300.0000 [IU]/h | INTRAMUSCULAR | Status: DC
  Administered 2015-12-18: 1200 [IU]/h via INTRAVENOUS
  Administered 2015-12-18 – 2015-12-20 (×3): 1300 [IU]/h via INTRAVENOUS
  Filled 2015-12-17 (×9): qty 250

## 2015-12-17 MED ORDER — AMIODARONE HCL IN DEXTROSE 360-4.14 MG/200ML-% IV SOLN
60.0000 mg/h | INTRAVENOUS | Status: AC
  Administered 2015-12-17: 60 mg/h via INTRAVENOUS
  Filled 2015-12-17: qty 200

## 2015-12-17 MED ORDER — MIDAZOLAM HCL 2 MG/2ML IJ SOLN: 2.0000 mg | INTRAMUSCULAR | Status: DC | PRN

## 2015-12-17 NOTE — Progress Notes (Signed)
ANTICOAGULATION/ANTIBIOTIC CONSULT NOTE - Initial Consult  Pharmacy Consult for Heparin, Vancomycin, and Zosyn Indication: r/o PE, multifocal PNA  Allergies  Allergen Reactions  . Oxycodone Itching    Patient Measurements: Weight: 200 lb 2.8 oz (90.8 kg)  Ht: 64 in  IBW: 55 kg Heparin Dosing Weight: 75 kg  Vital Signs: Temp: 99.1 F (37.3 C) (06/17 2318) Temp Source: Oral (06/17 2318) BP: 116/75 mmHg (06/17 2300) Pulse Rate: 108 (06/17 2300)  Labs: No results for input(s): HGB, HCT, PLT, APTT, LABPROT, INR, HEPARINUNFRC, HEPRLOWMOCWT, CREATININE, CKTOTAL, CKMB, TROPONINI in the last 72 hours.  CrCl cannot be calculated (Unknown ideal weight.).   Weight: 200 lb 2.8 oz (90.8 kg)  Temp (24hrs), Avg:99.9 F (37.7 C), Min:99.1 F (37.3 C), Max:100.3 F (37.9 C)  No results for input(s): WBC, CREATININE, LATICACIDVEN, VANCOTROUGH, VANCOPEAK, VANCORANDOM, GENTTROUGH, GENTPEAK, GENTRANDOM, TOBRATROUGH, TOBRAPEAK, TOBRARND, AMIKACINPEAK, AMIKACINTROU, AMIKACIN in the last 168 hours.  CrCl cannot be calculated (Unknown ideal weight.).    Antimicrobials this admission: 6/16 Vanc >>  6/16 Meropenem  >> 6/17 6/16 Azith >> 6/17 6/17 Zosyn >> 6/17 Bactrim >>  Dose adjustments this admission: n/a  Microbiology results: BCx x2:  Trach asp:  MRSA PCR:   Medical History: Past Medical History  Diagnosis Date  . HIV (human immunodeficiency virus infection)   . Pancreatitis     Medications:  Awaiting electronic med rec  Assessment: Sherry Strickland with PMH significant for HIV [facility] 12[facility]16/15 with c/o of SOB, cough, and fevers. Pt admitted for sepsis 2/2 multifocal pneumonia. On 01/02/16, she suffered an acute cardiac arrest. Now transferred [facility] care.  Labs at outside hospital 6/17: Na 146, K 4.1, Cl 111, bicarb 10, SCr 2.04, gluc 290, Hgb 9.9, WBC 10.2, plt 71  AC: Bedside echo showed possible RV strain with TR concerning for possible  acute PE and heparin gtt was initiated at [facility]. Heparin load 5500 units and 1200 units/hr started 6/17 ~1445. Heparin gtt turned off ~2200 when [facility]. Now with orders to restart. Baseline plt 71 this admission (noted plt ~130 one yr ago).  ID: Originially started on Rocephin and Azithromycin then broadened to Vancomycin and Merrem, Azithromycin. Pt with HIV (antiretroviral therapy with Reyataz, Truvada and Norvir)  [facility]: Vanc 2250mg  ~2200 6/16, Merrem 500mg  ~1330 6/17, and Azith 500mg  6/16 ~1915.  6/16 Vanc >>  6/16 Meropenem  >> 6/17 6/16 Azith >> 6/17 6/17 Zosyn >> 6/17 Bactrim >>  BCx x2:  Trach asp:  MRSA PCR:   Nephro: SCr at outside hospital 2.04, est CrCl 35 ml/min (noted that SCr 6/16 was >3)  Goal of Therapy:  Heparin level 0.3-0.7 units/ml Monitor platelets by anticoagulation protocol: Yes  Vanc trough 15-20 mcg/ml   Plan:  Resume heparin at 1200 units/hr F/u 6 hr heparin level Daily heparin level and CBC Vancomycin 1250mg  IV q24h Zosyn 3.375gm IV q8h - each dose over 4 hours Will f/u micro data, renal function, and pt's clinical condition Vanc trough prn  Christoper Fabianaron Abiola Behring, PharmD, BCPS Clinical pharmacist, pager [phone] 10/26/15,11:31 PM

## 2015-12-17 NOTE — H&P (Signed)
PULMONARY / CRITICAL CARE MEDICINE   Name: Sherry Strickland MRN: [id number] DOB: 1981/03/13    ADMISSION DATE:  2015/12/19 CONSULTATION DATE:  Dec 19, 2015  REFERRING MD:    CHIEF COMPLAINT:  Cardiac arrest, pneumonia, shock  HISTORY OF PRESENT ILLNESS:   Ms. Sherry Strickland is a 78F with PMH significant for HIV who presented to the [facility] ED on 12/16/15 with complaints of shortness of breath, cough, and fevers. She was febrile, tachy, hypotensive, tachypneic and had a new oxygen requirement. Labs were notable for a metabolic acidosis, acute kidney injury, leukopenia (2.7), and mild elevations of alkP and AST. She was admitted for sepsis 2/2 multifocal pneumonia and started on rocephin and azithromycin. She has reportedly been on antiretroviral therapy with Reyataz, Truvada and Norvir.  Once admitted, her antibiotics were broadened to vanc, merrem, azithro. Crypto antigen was sent.  On Dec 19, 2015, she suffered an acute cardiac arrest at 12:55pm. ACLS was initiated and 2 rounds of CPR, 2 amps of EPI, 2 shocks (for VT and VF) and amiodarone were given. ROSC at 1:01pm. Bedside echo showed possible RV strain with TR concerning for possible acute PE and heparin gtt was initiated. She was started on pressors. Additional workup prior to transfer included a spot EEG, but no results were included in the record. CD4 count and viral load were reportedly sent at the outside hospital.   On arrival to the MICU, she is responsive only to painful stimuli. She continues on norepi, a bicarb gtt, and propofol for sedation. She exhibits intermittent twitching/jerking movements.   PAST MEDICAL HISTORY :  She  has a past medical history of HIV (human immunodeficiency virus infection) and Pancreatitis.  PAST SURGICAL HISTORY: She  has past surgical history that includes Cholecystectomy and left tibia surgery.  Allergies  Allergen Reactions  . Oxycodone Itching    No current facility-administered medications on file prior to  encounter.   Current Outpatient Prescriptions on File Prior to Encounter  Medication Sig  . atazanavir (REYATAZ) 150 MG capsule Take 300 mg by mouth at bedtime.  . dapsone 100 MG tablet Take one tablet daily after you have finished primaquine and clindamycin.  Marland Kitchen emtricitabine-tenofovir (TRUVADA) 200-300 MG per tablet Take 1 tablet by mouth at bedtime.  . fluticasone (FLONASE) 50 MCG/ACT nasal spray Place 2 sprays into both nostrils daily.  Marland Kitchen gabapentin (NEURONTIN) 400 MG capsule Take 1 capsule (400 mg total) by mouth 4 (four) times daily.  . nicotine (NICODERM CQ - DOSED IN MG/24 HOURS) 21 mg/24hr patch Place 1 patch (21 mg total) onto the skin daily.  Marland Kitchen oxyCODONE (OXY IR/ROXICODONE) 5 MG immediate release tablet Take 1 tablet (5 mg total) by mouth every 6 (six) hours as needed for moderate pain.  . predniSONE (DELTASONE) 10 MG tablet Take 5 tab on day 1 with breakfast, then 4 tabs on day 2, 3 tabs on day 3, 2 tabs on day 4, and 1 tab on day 5  . QUEtiapine (SEROQUEL) 300 MG tablet Take 2 tablets (600 mg total) by mouth at bedtime.  . ritonavir (NORVIR) 100 MG capsule Take 100 mg by mouth at bedtime.    FAMILY HISTORY:  Her has no family status information on file.   SOCIAL HISTORY: She  reports that she has been smoking.  She does not have any smokeless tobacco history on file. She reports that she does not drink alcohol or use illicit drugs.  UDS + for amphetamine and benzos  REVIEW OF SYSTEMS:   Unable to obtain  2/2 intubated state  SUBJECTIVE:    VITAL SIGNS: There were no vitals taken for this visit.  HEMODYNAMICS:    VENTILATOR SETTINGS:    INTAKE / OUTPUT:    PHYSICAL EXAMINATION:  General Well nourished, well developed, obese, intubated, sedated  HEENT Pupils 4mm bilaterally and minimally responsive. ETT / OGT in place. Poor dentition.   Pulmonary Coarse bilaterally with ronchi on the right. Vent-assisted effort, symmetrical expansion.   Cardiovascular  tachycardic, regular rhythm. S1, s2. No m/r/g. Distal pulses palpable.  Abdomen Obese, mildly distended, positive bowel sounds, no palpable organomegaly or masses. Normoresonant to percussion.  Musculoskeletal Grossly normal   Lymphatics No cervical, supraclavicular or axillary adenopathy.   Neurologic Grossly intact. No focal deficits.   Skin/Integuement No rash, no cyanosis, no clubbing. Multiple tattoos.     LABS: see care everywhere for outside hospital results.  BMET No results for input(s): NA, K, CL, CO2, BUN, CREATININE, GLUCOSE in the last 168 hours.  Electrolytes No results for input(s): CALCIUM, MG, PHOS in the last 168 hours.  CBC No results for input(s): WBC, HGB, HCT, PLT in the last 168 hours.  Coag's No results for input(s): APTT, INR in the last 168 hours.  Sepsis Markers No results for input(s): LATICACIDVEN, PROCALCITON, O2SATVEN in the last 168 hours.  ABG No results for input(s): PHART, PCO2ART, PO2ART in the last 168 hours.  Liver Enzymes No results for input(s): AST, ALT, ALKPHOS, BILITOT, ALBUMIN in the last 168 hours.  Cardiac Enzymes No results for input(s): TROPONINI, PROBNP in the last 168 hours.  Glucose No results for input(s): GLUCAP in the last 168 hours.  Imaging No results found.   STUDIES:  CT Chest WO 12/16/15 Impression:  1. Large masslike consolidation in the right upper lobe with smaller consolidation in the right lower lobe and clustered centrilobular nodules in the right middle lobe concerning for multifocal pneumonia. Recommend follow up to radiographic resolution .  TTE 07-16-15 There is normal left ventricular wall thickness. There is mild to moderate global hypokinesis of the left ventricle. Ejection Fraction = 35-45%. The right ventricle is mildly dilated. The right ventricular systolic function is mildly reduced. There is mild mitral regurgitation. There is mild to moderate tricuspid regurgitation. Right ventricular  systolic pressure is elevated at 30-7340mmHg.  EEG 6/17 This is a 21- channel digital EEG recording with one channel  devoted to limited EKG recording. Electrodes were placed in  accordance with the International 10-20 system and various  montages were utilized.   The background rhythm in the record consists of very poorly  organized and very poorly developed waves with a frequency of 2-3  Hz delta activity, low amplitude bilaterally synchronous and  symmetrical. Intermixed were: intermittent muscle contraction  artifacts from myoclonus without epileptiform discharges.   Interpretation: Severe suppressed and significant diffuse slowing  background indicates severe generalized neurophysiological  disturbance. The diffuse slowing can be seen in patients with  toxic or metabolic disorders, neurodegenerative disorders, or  recent seizure. Again, intermittent muscle contraction artifacts  from myoclonus was observed without epileptiform discharges  correlation.   CULTURES: Blood OSH 6/16 >> Blood 6/17 >> Urine OSH 6/16 >> Crypto Ag >> negative  ANTIBIOTICS: Vanc / Merrem / Azithro 6/16 >> 6/17 Vanc / zosyn / bactrim 6/17 >>  SIGNIFICANT EVENTS:   LINES/TUBES: RIJ 6/17 >> ETT 6/17 >> Foley 6/17 >>  DISCUSSION: Ms. Joseph ArtWoods is a 70F who presented to an outside hospital with sepsis thought secondary to multifocal pneumonia. She then  suffered a cardiac arrest with documentation of both VT and VF. Post-code TTE was suggestive of RH strain and empiric heparin was initiated. Other significant findings include UDS positive for amphetamine and benzos, acute kidney injury (baseline unknown), myoclonic jerks on exam and outside EEG, systolic dysfunction with EF 20-25% and underlying HIV/AIDS.   ASSESSMENT / PLAN:  PULMONARY A: Acute hypoxemic respiratory failure Multifocal pneumonia Concern for pulmonary embolism P:   CXR Ventilatory support; wean as tolerated Continue empiric  antibiotics with vanc / zosyn / bactrim Continue empiric heparin for now If oxygenation fails to improve, will increase sedation and consider paralytics  CARDIOVASCULAR A:  Cardiac arrest (VT/VF) Shock (cardiogenic vs distributive) Systolic dysfunction EF 20-25% (chronicity unknown) RV dilation w/ reduced systolic function P:  Continue amiodarone Continue pressor support with norepi Trend lactate Continue empiric heparin gtt  RENAL A:   Acute kidney injury (baseline unknown) 3.53 to 2.04 at OSH; 1.03 in 07/2014 Metabolic acidosis (improved) P:   Maintain MAP > 65 Trend Cr, lytes D/c bicarb gtt for now Repeat labs here Avoid nephrotoxins  GASTROINTESTINAL A:   No acute issues P:   NPO GI ppx  OGT  HEMATOLOGIC A:   Concern for acute PE P:  Continue heparin for now  INFECTIOUS A:   Multifocal pneumonia; risk factors for PCP P:   Continue empiric vanc / zosyn / bactrim Follow outside cultures Resend blood cultures, tracheal aspirate  ENDOCRINE A:   Hyperglycemia - likely iatrogenic from D5 gtt   P:   CBG / SSI as needed  NEUROLOGIC A:   Altered mental status with myoclonic jerks; etiology unclear P:   RASS goal: 0 Neurology to see Keppra initiated empirically   FAMILY  - Updates: family updated  - Inter-disciplinary family meet or Palliative Care meeting due by:  day 7  The patient is critically ill with multiple organ system failure and requires high complexity decision making for assessment and support, frequent evaluation and titration of therapies, advanced monitoring, review of radiographic studies and interpretation of complex data.   Critical Care Time devoted to patient care services, exclusive of separately billable procedures, described in this note is 65 minutes.   Nita Sickle, MD Pulmonary and Critical Care Medicine [facility] Pager: [phone]  12/16/2015, 10:05 PM

## 2015-12-18 ENCOUNTER — Inpatient Hospital Stay (HOSPITAL_COMMUNITY): Payer: Medicaid - Out of State

## 2015-12-18 DIAGNOSIS — I469 Cardiac arrest, cause unspecified: Secondary | ICD-10-CM

## 2015-12-18 DIAGNOSIS — G931 Anoxic brain damage, not elsewhere classified: Secondary | ICD-10-CM

## 2015-12-18 DIAGNOSIS — J9601 Acute respiratory failure with hypoxia: Secondary | ICD-10-CM

## 2015-12-18 DIAGNOSIS — R570 Cardiogenic shock: Secondary | ICD-10-CM

## 2015-12-18 LAB — BLOOD GAS, ARTERIAL
ACID-BASE DEFICIT: 7.4 mmol/L — AB (ref 0.0–2.0)
ACID-BASE DEFICIT: 7.5 mmol/L — AB (ref 0.0–2.0)
Acid-base deficit: 5.6 mmol/L — ABNORMAL HIGH (ref 0.0–2.0)
BICARBONATE: 19.4 meq/L — AB (ref 20.0–24.0)
Bicarbonate: 20.2 mEq/L (ref 20.0–24.0)
Bicarbonate: 18.2 mEq/L — ABNORMAL LOW (ref 20.0–24.0)
DRAWN BY: 24513
Drawn by: 245131
Drawn by: 24513
FIO2: 1
FIO2: 1
FIO2: 1
MECHVT: 460 mL
MECHVT: 460 mL
MECHVT: 500 mL
O2 SAT: 78.8 %
O2 Saturation: 73.4 %
O2 Saturation: 92.1 %
PATIENT TEMPERATURE: 99
PATIENT TEMPERATURE: 102.2
PCO2 ART: 58.4 mmHg — AB (ref 35.0–45.0)
PEEP/CPAP: 14 cmH2O
PEEP/CPAP: 12 cmH2O
PEEP: 12 cmH2O
PH ART: 7.26 — AB (ref 7.350–7.450)
PH ART: 7.163 — AB (ref 7.350–7.450)
PO2 ART: 46.7 mmHg — AB (ref 80.0–100.0)
PO2 ART: 62.3 mmHg — AB (ref 80.0–100.0)
Patient temperature: 103
RATE: 30 resp/min
RATE: 30 resp/min
RATE: 35 resp/min
TCO2: 21.6 mmol/L (ref 0–100)
TCO2: 21.1 mmol/L (ref 0–100)
TCO2: 19.5 mmol/L (ref 0–100)
pCO2 arterial: 46.8 mmHg — ABNORMAL HIGH (ref 35.0–45.0)
pCO2 arterial: 47 mmHg — ABNORMAL HIGH (ref 35.0–45.0)
pH, Arterial: 7.23 — ABNORMAL LOW (ref 7.350–7.450)
pO2, Arterial: 86.3 mmHg (ref 80.0–100.0)

## 2015-12-18 LAB — COMPREHENSIVE METABOLIC PANEL
ALBUMIN: 2 g/dL — AB (ref 3.5–5.0)
ALK PHOS: 107 U/L (ref 38–126)
ALT: 49 U/L (ref 14–54)
AST: 80 U/L — AB (ref 15–41)
Anion gap: 10 (ref 5–15)
BILIRUBIN TOTAL: 2 mg/dL — AB (ref 0.3–1.2)
BUN: 38 mg/dL — AB (ref 6–20)
CALCIUM: 6.6 mg/dL — AB (ref 8.9–10.3)
CO2: 20 mmol/L — AB (ref 22–32)
CREATININE: 2.14 mg/dL — AB (ref 0.44–1.00)
Chloride: 115 mmol/L — ABNORMAL HIGH (ref 101–111)
GFR calc Af Amer: 33 mL/min — ABNORMAL LOW (ref >60)
GFR calc non Af Amer: 29 mL/min — ABNORMAL LOW (ref >60)
GLUCOSE: 176 mg/dL — AB (ref 65–99)
Potassium: 3.7 mmol/L (ref 3.5–5.1)
SODIUM: 145 mmol/L (ref 135–145)
TOTAL PROTEIN: 5.7 g/dL — AB (ref 6.5–8.1)

## 2015-12-18 LAB — GLUCOSE, CAPILLARY
GLUCOSE-CAPILLARY: 172 mg/dL — AB (ref 65–99)
GLUCOSE-CAPILLARY: 195 mg/dL — AB (ref 65–99)
GLUCOSE-CAPILLARY: 187 mg/dL — AB (ref 65–99)
Glucose-Capillary: 138 mg/dL — ABNORMAL HIGH (ref 65–99)
Glucose-Capillary: 194 mg/dL — ABNORMAL HIGH (ref 65–99)
Glucose-Capillary: 199 mg/dL — ABNORMAL HIGH (ref 65–99)

## 2015-12-18 LAB — TROPONIN I
TROPONIN I: 2.06 ng/mL — AB (ref <0.031)
Troponin I: 2.83 ng/mL (ref <0.031)
Troponin I: 3.5 ng/mL (ref <0.031)

## 2015-12-18 LAB — BASIC METABOLIC PANEL
Anion gap: 13 (ref 5–15)
BUN: 40 mg/dL — AB (ref 6–20)
CALCIUM: 6.6 mg/dL — AB (ref 8.9–10.3)
CHLORIDE: 112 mmol/L — AB (ref 101–111)
CO2: 20 mmol/L — AB (ref 22–32)
CREATININE: 2.27 mg/dL — AB (ref 0.44–1.00)
GFR calc non Af Amer: 27 mL/min — ABNORMAL LOW (ref >60)
GFR, EST AFRICAN AMERICAN: 31 mL/min — AB (ref >60)
GLUCOSE: 189 mg/dL — AB (ref 65–99)
Potassium: 4.2 mmol/L (ref 3.5–5.1)
Sodium: 145 mmol/L (ref 135–145)

## 2015-12-18 LAB — CBC
HEMATOCRIT: 29.6 % — AB (ref 36.0–46.0)
HEMATOCRIT: 29.9 % — AB (ref 36.0–46.0)
HEMOGLOBIN: 9.9 g/dL — AB (ref 12.0–15.0)
Hemoglobin: 9.9 g/dL — ABNORMAL LOW (ref 12.0–15.0)
MCH: 33.4 pg (ref 26.0–34.0)
MCH: 32.9 pg (ref 26.0–34.0)
MCHC: 33.4 g/dL (ref 30.0–36.0)
MCHC: 33.1 g/dL (ref 30.0–36.0)
MCV: 100 fL (ref 78.0–100.0)
MCV: 99.3 fL (ref 78.0–100.0)
PLATELETS: 93 10*3/uL — AB (ref 150–400)
Platelets: 94 10*3/uL — ABNORMAL LOW (ref 150–400)
RBC: 2.96 MIL/uL — AB (ref 3.87–5.11)
RBC: 3.01 MIL/uL — ABNORMAL LOW (ref 3.87–5.11)
RDW: 22.4 % — ABNORMAL HIGH (ref 11.5–15.5)
RDW: 22.4 % — AB (ref 11.5–15.5)
WBC: 16.7 10*3/uL — ABNORMAL HIGH (ref 4.0–10.5)
WBC: 16.5 10*3/uL — ABNORMAL HIGH (ref 4.0–10.5)

## 2015-12-18 LAB — TRIGLYCERIDES: Triglycerides: 413 mg/dL — ABNORMAL HIGH (ref <150)

## 2015-12-18 LAB — MAGNESIUM: Magnesium: 2.3 mg/dL (ref 1.7–2.4)

## 2015-12-18 LAB — PHOSPHORUS: PHOSPHORUS: 6.2 mg/dL — AB (ref 2.5–4.6)

## 2015-12-18 LAB — MRSA PCR SCREENING: MRSA BY PCR: NEGATIVE

## 2015-12-18 LAB — LACTIC ACID, PLASMA: Lactic Acid, Venous: 2.3 mmol/L (ref 0.5–2.0)

## 2015-12-18 LAB — HEPARIN LEVEL (UNFRACTIONATED)
HEPARIN UNFRACTIONATED: 0.32 [IU]/mL (ref 0.30–0.70)
Heparin Unfractionated: 0.49 IU/mL (ref 0.30–0.70)

## 2015-12-18 MED ORDER — WHITE PETROLATUM GEL
Status: AC
  Administered 2015-12-18: 0.2
  Filled 2015-12-18: qty 1

## 2015-12-18 MED ORDER — DEXTROSE IN LACTATED RINGERS 5 % IV SOLN
INTRAVENOUS | Status: DC
  Administered 2015-12-18 – 2015-12-19 (×2): via INTRAVENOUS

## 2015-12-18 MED ORDER — ACETAMINOPHEN 160 MG/5ML PO SOLN
650.0000 mg | Freq: Four times a day (QID) | ORAL | Status: DC | PRN
  Administered 2015-12-18: 650 mg
  Filled 2015-12-18: qty 20.3

## 2015-12-18 MED ORDER — SULFAMETHOXAZOLE-TRIMETHOPRIM 400-80 MG/5ML IV SOLN
300.0000 mg | Freq: Three times a day (TID) | INTRAVENOUS | Status: DC
  Administered 2015-12-18 (×2): 300 mg via INTRAVENOUS
  Filled 2015-12-18 (×5): qty 18.8

## 2015-12-18 NOTE — Progress Notes (Signed)
eLink Physician-Brief Progress Note Patient Name: Sherry Strickland. DOB: 03/12/1981 MRN: [id number]   Date of Service  12/18/2015  HPI/Events of Note  Temp of 38.6  eICU Interventions  PRN tylenol order     Intervention Category Minor Interventions: Routine modifications to care plan (e.g. PRN medications for pain, fever)  DETERDING,ELIZABETH 12/18/2015, 2:17 AM

## 2015-12-18 NOTE — Progress Notes (Signed)
ANTICOAGULATION CONSULT NOTE - Follow-up Consult  Pharmacy Consult:  Heparin Indication:  Rule out PE  Allergies  Allergen Reactions  . Oxycodone Itching    Patient Measurements: Height: 5\' 5"  (165.1 cm) Weight: 200 lb 2.8 oz (90.8 kg) IBW/kg (Calculated) : 57  Ht: 64 in  IBW: 55 kg Heparin Dosing Weight: 75 kg  Vital Signs: Temp: 102.9 F (39.4 C) (06/18 0700) Temp Source: Core (Comment) (06/18 0700) BP: 128/56 mmHg (06/18 0715) Pulse Rate: 108 (06/18 0715)  Labs:  Recent Labs  12/18/15 0256 12/18/15 0445  HGB 9.9* 9.9*  HCT 29.6* 29.9*  PLT 94* 93*  HEPARINUNFRC  --  0.32  CREATININE 2.14* 2.27*  TROPONINI 2.83* 3.50*    Estimated Creatinine Clearance: 38.5 mL/min (by C-G formula based on Cr of 2.27).   Height: 5\' 5"  (165.1 cm) Weight: 200 lb 2.8 oz (90.8 kg) IBW/kg (Calculated) : 57  Temp (24hrs), Avg:101.4 F (38.6 C), Min:99.1 F (37.3 C), Max:102.9 F (39.4 C)   Recent Labs Lab 12/18/15 0253 12/18/15 0256 12/18/15 0445  WBC  --  16.7* 16.5*  CREATININE  --  2.14* 2.27*  LATICACIDVEN 2.3*  --   --     Estimated Creatinine Clearance: 38.5 mL/min (by C-G formula based on Cr of 2.27).      Assessment: 34 YOF on IV heparin for rule out PE.  Heparin level is therapeutic; no bleeding reported.   Goal of Therapy:  Heparin level 0.3-0.7 units/ml Monitor platelets by anticoagulation protocol: Yes     Plan:  - Continue heparin gtt at 1300 units/hr - Daily HL / CBC   Sheyna Pettibone D. Laney Potashang, PharmD, BCPS Pager:  [phone] 12/18/2015, 12:52 PM

## 2015-12-18 NOTE — Progress Notes (Signed)
PULMONARY / CRITICAL CARE MEDICINE   Name: Sherry Strickland MRN: [id number] DOB: [birth year]    ADMISSION DATE:  January 12, 2016 CONSULTATION DATE:  01/12/16  REFERRING MD:    CHIEF COMPLAINT:  Cardiac arrest, pneumonia, shock  HISTORY OF PRESENT ILLNESS:   Sherry Strickland is a 35F with PMH significant for HIV who presented to the [facility] ED on 12/16/15 with complaints of shortness of breath, cough, and fevers. She was febrile, tachy, hypotensive, tachypneic and had a new oxygen requirement. Labs were notable for a metabolic acidosis, acute kidney injury, leukopenia (2.7), and mild elevations of alkP and AST. She was admitted for sepsis 2/2 multifocal pneumonia and started on rocephin and azithromycin. She has reportedly been on antiretroviral therapy with Reyataz, Truvada and Norvir.  Once admitted, her antibiotics were broadened to vanc, merrem, azithro. Crypto antigen was sent.  On Jan 12, 2016, she suffered an acute cardiac arrest at 12:55pm. ACLS was initiated and 2 rounds of CPR, 2 amps of EPI, 2 shocks (for VT and VF) and amiodarone were given. ROSC at 1:01pm. Bedside echo showed possible RV strain with TR concerning for possible acute PE and heparin gtt was initiated. She was started on pressors. Additional workup prior to transfer included a spot EEG, but no results were included in the record. CD4 count and viral load were reportedly sent at the outside hospital.   On arrival to the MICU, she is responsive only to painful stimuli. She continues on norepi, a bicarb gtt, and propofol for sedation. She exhibits intermittent twitching/jerking movements.   SUBJECTIVE:  No sig change. Desaturated when placed flat.   VITAL SIGNS: BP 131/70 mmHg  Pulse 96  Temp(Src) 102.9 F (39.4 C) (Core (Comment))  Resp 35  Ht 5\' 5"  (1.651 m)  Wt 200 lb 2.8 oz (90.8 kg)  BMI 33.31 kg/m2  SpO2 95%  HEMODYNAMICS:    VENTILATOR SETTINGS: Vent Mode:  [-] PRVC FiO2 (%):  [100 %] 100 % Set Rate:  [10 bmp-35  bmp] 35 bmp Vt Set:  [350 mL-520 mL] 500 mL PEEP:  [5 cmH20-12 cmH20] 12 cmH20 Plateau Pressure:  [32 cmH20-37 cmH20] 36 cmH20  INTAKE / OUTPUT: I/O last 3 completed shifts: In: 1144.7 [I.V.:814.7; NG/GT:30; IV Piggyback:300] Out: 130 [Urine:130]  PHYSICAL EXAMINATION:  General Well nourished, well developed, obese, intubated, sedated  HEENT Pupils 4mm bilaterally and minimally responsive. ETT / OGT in place. Poor dentition.   Pulmonary Coarse bilaterally with ronchi on the right > left  Vent-assisted effort, symmetrical expansion.   Cardiovascular tachycardic, regular rhythm. S1, s2. No m/r/g. Distal pulses palpable.  Abdomen Obese, mildly distended, positive bowel sounds, no palpable organomegaly or masses. Normoresonant to percussion.  Musculoskeletal Grossly normal   Lymphatics No cervical, supraclavicular or axillary adenopathy.   Neurologic Grossly intact. No focal deficits.   Skin/Integuement No rash, no cyanosis, no clubbing. Multiple tattoos.     LABS: see care everywhere for outside hospital results.  BMET  Recent Labs Lab 12/18/15 0256 12/18/15 0445  NA 145 145  K 3.7 4.2  CL 115* 112*  CO2 20* 20*  BUN 38* 40*  CREATININE 2.14* 2.27*  GLUCOSE 176* 189*    Electrolytes  Recent Labs Lab 12/18/15 0256 12/18/15 0445  CALCIUM 6.6* 6.6*  MG  --  2.3  PHOS  --  6.2*    CBC  Recent Labs Lab 12/18/15 0256 12/18/15 0445  WBC 16.7* 16.5*  HGB 9.9* 9.9*  HCT 29.6* 29.9*  PLT 94* 93*    Coag's  No results for input(s): APTT, INR in the last 168 hours.  Sepsis Markers  Recent Labs Lab 12/18/15 0253  LATICACIDVEN 2.3*    ABG  Recent Labs Lab 12/18/15 0120 12/18/15 0404 12/18/15 0657  PHART 7.260* 7.163* 7.230*  PCO2ART 46.8* 58.4* 47.0*  PO2ART 46.7* 62.3* 86.3    Liver Enzymes  Recent Labs Lab 12/18/15 0256  AST 80*  ALT 49  ALKPHOS 107  BILITOT 2.0*  ALBUMIN 2.0*    Cardiac Enzymes  Recent Labs Lab 12/18/15 0256  12/18/15 0445  TROPONINI 2.83* 3.50*    Glucose  Recent Labs Lab 12/28/2015 2209 12/06/2015 2310 12/18/15 0444 12/18/15 0818 12/18/15 1133  GLUCAP 187* 138* 194* 172* 195*    Imaging Ct Head Wo Contrast  12/18/2015  CLINICAL DATA:  Myoclonus.  Post cardiac arrest. EXAM: CT HEAD WITHOUT CONTRAST TECHNIQUE: Contiguous axial images were obtained from the base of the skull through the vertex without intravenous contrast. COMPARISON:  None. FINDINGS: Brain: No intracranial hemorrhage, mass effect, or midline shift. No evidence of cerebral edema. No hydrocephalus. The basilar cisterns are patent. No evidence of territorial infarct. No intracranial fluid collection. Vascular: No hyperdense vessel or abnormal calcification. Skull:  Calvarium is intact. Sinuses/Orbits: Mucosal thickening and opacity in the right side of sphenoid sinus, scattered mucosal thickening throughout the ethmoid air cells. Mastoid air cells are well aerated. Other: None. IMPRESSION: No acute intracranial abnormality. Electronically Signed   By: Rubye Oaks M.D.   On: 12/18/2015 01:02   Dg Chest Port 1 View  12/18/2015  CLINICAL DATA:  Decreased oxygen saturation.  In patient. EXAM: PORTABLE CHEST 1 VIEW COMPARISON:  12/21/2015 FINDINGS: Focal airspace consolidation in the right upper lobe abutting the minor fissure is similar to the previous exam. There is improved aeration with decreased airspace opacity throughout the left lung and in the right lower lung. There are no new areas of lung opacification. Probable small pleural effusions.  No evidence of a pneumothorax. Endotracheal tube, right internal jugular central venous line and nasal/orogastric tube are stable and well positioned. IMPRESSION: 1. Despite the history, lung aeration appears improved since the previous day's exam. There are no new abnormalities. 2. Support apparatus is stable and well positioned. Electronically Signed   By: Amie Portland M.D.   On: 12/18/2015  10:45   Dg Chest Port 1 View  12/16/2015  CLINICAL DATA:  Myoclonus.  Tube placement. EXAM: PORTABLE CHEST 1 VIEW COMPARISON:  07/05/2015 FINDINGS: Endotracheal tube tip projects 3.8 cm above the car melena. Nasal/ orogastric tube passes well below the diaphragm into the stomach. Right internal jugular central venous line tip projects in the mid superior vena cava. There are bilateral airspace opacities with evidence of a small right pleural effusion. No pneumothorax. Cardiac silhouette is normal in size. No mediastinal or hilar masses. IMPRESSION: 1. Lines and tubes well positioned as described.  No pneumothorax. 2. Bilateral airspace lung opacities and small right pleural effusion. Findings may reflect pulmonary edema or bilateral pneumonia. Electronically Signed   By: Amie Portland M.D.   On: 12/16/2015 23:21     STUDIES:  CT Chest WO 12/16/15 Impression:  1. Large masslike consolidation in the right upper lobe with smaller consolidation in the right lower lobe and clustered centrilobular nodules in the right middle lobe concerning for multifocal pneumonia. Recommend follow up to radiographic resolution .  TTE 12/01/2015 There is normal left ventricular wall thickness.There is mild to moderate global hypokinesis of the left ventricle. Ejection Fraction = 35-45%.  The right ventricle is mildly dilated. The right ventricular systolic function is mildly reduced. There is mild mitral regurgitation. There is mild to moderate tricuspid regurgitation. Right ventricular systolic pressure is elevated at 30-37mmHg.  EEG 6/17: Interpretation: Severe suppressed and significant diffuse slowing background indicates severe generalized neurophysiological disturbance. The diffuse slowing can be seen in patients with  toxic or metabolic disorders, neurodegenerative disorders, or recent seizure. Again, intermittent muscle contraction artifacts from myoclonus was observed without epileptiform discharges correlation.    CULTURES: Blood OSH 6/16 >> Blood 6/17 >> Urine OSH 6/16 >> Crypto Ag >> negative  ANTIBIOTICS: Vanc / Merrem / Azithro 6/16 >> 6/17 Vanc / zosyn / bactrim 6/17 >>  SIGNIFICANT EVENTS:   LINES/TUBES: RIJ 6/17 >> ETT 6/17 >> Foley 6/17 >>   DISCUSSION:  Sherry Strickland is a 62 F who presented to an outside hospital with sepsis thought secondary to multifocal pneumonia. She then suffered a cardiac arrest with documentation of both VT and VF. Post-code TTE was suggestive of RH strain and empiric heparin was initiated. Other significant findings include UDS positive for amphetamine and benzos, acute kidney injury (baseline unknown), myoclonic jerks on exam and outside EEG, systolic dysfunction with EF 20-25% and underlying HIV/AIDS. Will cont current supportive care including abx, pressors, and vent support. Hope we can cont to wean FIO2, peep and gtt needs. Next of concern will be the results of pending EEG and f/u on the potential of HIE.   ASSESSMENT / PLAN:  PULMONARY A: Acute hypoxemic respiratory failure Multifocal pneumonia Concern for pulmonary embolism P:   CXR Ventilatory support; wean as tolerated Continue empiric antibiotics with vanc / zosyn / bactrim Continue empiric heparin for now If oxygenation fails to improve, will increase sedation and consider paralytics  CARDIOVASCULAR A:  Cardiac arrest (VT/VF) Shock (cardiogenic vs distributive)-->pressor needs improving Systolic dysfunction EF 20-25% (chronicity unknown) RV dilation w/ reduced systolic function P:  Continue amiodarone Continue pressor support with norepi Trend lactate Continue empiric heparin gtt  RENAL A:   Acute kidney injury (baseline unknown) 3.53 to 2.04 at OSH; 1.03 in 07/2014 Metabolic acidosis (improved stable off bicarb gttt) P:   Maintain MAP > 65 Cont IVFs Trend Cr, lytes Repeat labs here Avoid nephrotoxins  GASTROINTESTINAL A:   No acute issues P:   NPO GI ppx   OGT-->start tube feeds   HEMATOLOGIC A:   Concern for acute PE P:  Continue heparin for now  INFECTIOUS A:   Multifocal pneumonia; risk factors for PCP P:   Continue empiric vanc / zosyn / bactrim Follow outside cultures Resent blood cultures, tracheal aspirate Order CD4 and viral load May need ID consult  ENDOCRINE A:   Hyperglycemia - likely iatrogenic from D5 gtt   P:   CBG / SSI as needed  NEUROLOGIC A:   Altered mental status with myoclonic jerks; etiology unclear but concerning for HIE P:   RASS goal: 0 Neurology to see Keppra initiated empirically F/u EEG  FAMILY  - Updates: family updated  Attending Note:  35 year old female with HIV that is non-compliant with medications who also is positive for benzos and meth who suffered a cardiac arrest in an outside hospital.  ROSC after 6 minutes and was transferred here posturing.  Currently with on propofol and fentanyl drip and completely unresponsive on exam.  CXR with diffuse infiltrate, I reviewed myself.   CD4 count is pending but PCP is pending.  Will continue treatment with vanc/zosyn/bactrim.  Stop all sedation  and re-evaluate mental status.  Continue current vent settings and pressor support.  The patient is critically ill with multiple organ systems failure and requires high complexity decision making for assessment and support, frequent evaluation and titration of therapies, application of advanced monitoring technologies and extensive interpretation of multiple databases.   Critical Care Time devoted to patient care services described in this note is  35  Minutes. This time reflects time of care of this signee Dr Koren BoundWesam Daphine Strickland. This critical care time does not reflect procedure time, or teaching time or supervisory time of PA/NP/Med student/Med Resident etc but could involve care discussion time.  Sherry ReedyWesam G. Chijioke Strickland, M.D. [facility] Pulmonary/Critical Care Medicine. Pager: [phone]. After hours pager:  [phone].  12/18/2015, 12:35 PM

## 2015-12-18 NOTE — Progress Notes (Signed)
eLink Physician-Brief Progress Note Patient Name: Sherry DuraCrystal Lolli V. DOB: 04-30-1981 MRN: [id number]   Date of Service  12/18/2015  HPI/Events of Note  Patient with elevated trop of 2.83 already on heparin  eICU Interventions  Order for 2D ECHO Continue to cycle trop Consider cardiology consult     Intervention Category Minor Interventions: Clinical assessment - ordering diagnostic tests  Nataliah Hatlestad 12/18/2015, 3:42 AM

## 2015-12-18 NOTE — Consult Note (Signed)
Neurology Consultation Reason for Consult: Myoclonus, seizures, recent history of cardiac arrest     HPI: Sherry Edwyna ShellWoods V. is a 35 y.o. female with history of HIV initially presented to outside facility Hospital on 12/16/2015 with shortness of breath and was found to have pneumonia and sepsis. On 12/19/2015 she was found to have cardiac arrest status post recent sedation, down to about 6 minutes. Now intubated and sedated. Upon presentation in our hospital it was noticed that she was having intermittent twitching of the face and mouth which was aborted by increasing the dose of propofol CT scan of the head is reported normal    PAST MEDICAL HISTORY :  She  has a past medical history of HIV (human immunodeficiency virus infection) and Pancreatitis.  PAST SURGICAL HISTORY: She  has past surgical history that includes Cholecystectomy and left tibia surgery.  Allergies  Allergen Reactions  . Oxycodone Itching    No current facility-administered medications on file prior to encounter.   Current Outpatient Prescriptions on File Prior to Encounter  Medication Sig  . atazanavir (REYATAZ) 150 MG capsule Take 300 mg by mouth at bedtime.  . dapsone 100 MG tablet Take one tablet daily after you have finished primaquine and clindamycin.  Marland Kitchen. emtricitabine-tenofovir (TRUVADA) 200-300 MG per tablet Take 1 tablet by mouth at bedtime.  . fluticasone (FLONASE) 50 MCG/ACT nasal spray Place 2 sprays into both nostrils daily.  Marland Kitchen. gabapentin (NEURONTIN) 400 MG capsule Take 1 capsule (400 mg total) by mouth 4 (four) times daily.  . nicotine (NICODERM CQ - DOSED IN MG/24 HOURS) 21 mg/24hr patch Place 1 patch (21 mg total) onto the skin daily.  Marland Kitchen. oxyCODONE (OXY IR/ROXICODONE) 5 MG immediate release tablet Take 1 tablet (5 mg total) by mouth every 6 (six) hours as needed for moderate pain.  . predniSONE (DELTASONE) 10 MG tablet Take 5 tab on day 1 with breakfast, then 4  tabs on day 2, 3 tabs on day 3, 2 tabs on day 4, and 1 tab on day 5  . QUEtiapine (SEROQUEL) 300 MG tablet Take 2 tablets (600 mg total) by mouth at bedtime.  . ritonavir (NORVIR) 100 MG capsule Take 100 mg by mouth at bedtime.    FAMILY HISTORY:  Her has no family status information on file.   SOCIAL HISTORY: She  reports that she has been smoking. She does not have any smokeless tobacco history on file. She reports that she does not drink alcohol or use illicit drugs.  UDS + for amphetamine and benzos  REVIEW OF SYSTEMS:  Unable to obtain 2/2 intubated state       Exam: Current vital signs: BP 115/75 mmHg  Pulse 120  Temp(Src) 100.8 F (38.2 C) (Oral)  Resp 28  Ht 5\' 5"  (1.651 m)  Wt 90.8 kg (200 lb 2.8 oz)  BMI 33.31 kg/m2  SpO2 97% Vital signs in last 24 hours: Temp:  [99.1 F (37.3 C)-100.8 F (38.2 C)] 100.8 F (38.2 C) (06/18 0100) Pulse Rate:  [108-120] 120 (06/18 0100) Resp:  [26-35] 28 (06/18 0100) BP: (102-116)/(72-75) 115/75 mmHg (06/18 0000) SpO2:  [78 %-99 %] 97 % (06/18 0130) FiO2 (%):  [100 %] 100 % (06/18 0132) Weight:  [90.8 kg (200 lb 2.8 oz)] 90.8 kg (200 lb 2.8 oz) (06/17 2200)  Physical Exam  General currently intubated and sedated Lungs, on ventilatory support, decreased breath sound in the lung bases  Neurological exam Currently patient is intubated and sedated unresponsive She has intact  brain stem reflexes, pupils are reactive bilaterally face seems symmetrical, corneal and gag reflexes intact. No response to painful stimuli  I have reviewed labs in epic and the results pertinent to this consultation are:  Most of the lab results are pending. Outside facility Hospital she had mildly elevated creatinine, she had an EEG although we don't have those results  I have reviewed the images obtained: CT scan of the head was reviewed by me which looks normal  Assessment and recommendations This is a 35 year old female who initially  presented with shortness of breath was found to have pneumonia and sepsis then she suffered cardiac arrest status post resuscitation downtime was reported about 6 minute Currently intubated and sedated unresponsive and was found to have twitching of the face and eyes suggesting seizure versus myoclonus. 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 Continue propofol per protocol for now. Will start her on continuous video EEG monitoring in the morning. Will titrate her seizure medications accordingly   Cy Blamer, MD 2:29 AM

## 2015-12-18 NOTE — Progress Notes (Signed)
Patient home meds and a ring given to EchoStarfiancee Andy Callowy.  Corliss SkainsJuan Tresa Jolley RN

## 2015-12-18 NOTE — Progress Notes (Signed)
Utilization review completed.  

## 2015-12-18 NOTE — Progress Notes (Signed)
ANTICOAGULATION CONSULT NOTE - Follow-up Consult  Pharmacy Consult for Heparin Indication: r/o PE  Allergies  Allergen Reactions  . Oxycodone Itching    Patient Measurements: Height: 5\' 5"  (165.1 cm) Weight: 200 lb 2.8 oz (90.8 kg) IBW/kg (Calculated) : 57  Ht: 64 in  IBW: 55 kg Heparin Dosing Weight: 75 kg  Vital Signs: Temp: 101.8 F (38.8 C) (06/18 0311) Temp Source: Oral (06/17 2318) BP: 101/84 mmHg (06/18 0311) Pulse Rate: 107 (06/18 0311)  Labs:  Recent Labs  12/18/15 0256 12/18/15 0445  HGB 9.9* 9.9*  HCT 29.6* 29.9*  PLT 94* 93*  HEPARINUNFRC  --  0.32  CREATININE 2.14* 2.27*  TROPONINI 2.83* 3.50*    Estimated Creatinine Clearance: 38.5 mL/min (by C-G formula based on Cr of 2.27).   Height: 5\' 5"  (165.1 cm) Weight: 200 lb 2.8 oz (90.8 kg) IBW/kg (Calculated) : 57  Temp (24hrs), Avg:100.7 F (38.2 C), Min:99.1 F (37.3 C), Max:101.8 F (38.8 C)   Recent Labs Lab 12/18/15 0253 12/18/15 0256 12/18/15 0445  WBC  --  16.7* 16.5*  CREATININE  --  2.14* 2.27*  LATICACIDVEN 2.3*  --   --     Estimated Creatinine Clearance: 38.5 mL/min (by C-G formula based on Cr of 2.27).    Medical History: Past Medical History  Diagnosis Date  . HIV (human immunodeficiency virus infection)   . Pancreatitis     Assessment: 12F on heparin for r/o PE. Heparin level at low end of therapeutic (0.32) on 1200 units/hr -drawn ~5 hrs post gtt restart after being off for an hour around transfer. No bleeding noted. Plt 93 [facility]  Goal of Therapy:  Heparin level 0.3-0.7 units/ml Monitor platelets by anticoagulation protocol: Yes    Plan:  Increase heparin to 1300 units/hr F/u 6 hr heparin level Daily heparin level and CBC  Christoper Fabianaron Disaya Walt, PharmD, BCPS Clinical pharmacist, pager [phone] 12/18/2015,5:27 AM

## 2015-12-18 NOTE — Progress Notes (Signed)
eLink Physician-Brief Progress Note Patient Name: Sherry DuraCrystal Domingo V. DOB: [birth year] MRN: [id number]   Date of Service  12/18/2015  HPI/Events of Note  Resp and metabolic acidosis on vent with pH 7.16/58/62/19  eICU Interventions  Plan: Increase RR on vent to 35 Increase TV to 500 cc Repeat ABG in 2 hours     Intervention Category Major Interventions: Acid-Base disturbance - evaluation and management  Sherry Strickland 12/18/2015, 4:37 AM

## 2015-12-18 NOTE — Progress Notes (Signed)
RN called d/t pt desat.  Sat 68-72% on 100% and 12 peep.  NP called to bedside, STAT c-xray ordered/called.  Pt bagged on 100% and peep valve easily.  Recruitment maneuver performed w/ no improvement.  Pt placed back on vent current settings.  Per NP, wait for c-xray results to eval if peep can be increased.  RN at bedside.

## 2015-12-18 NOTE — Procedures (Signed)
Arterial Catheter Insertion Procedure Note Sherry Strickland. [id number] 02/27/1981  Procedure: Insertion of Arterial Catheter  Indications: Blood pressure monitoring and Frequent blood sampling  Procedure Details Consent: Unable to obtain consent because of emergent medical necessity. Time Out: Verified patient identification, verified procedure, site/side was marked, verified correct patient position, special equipment/implants available, medications/allergies/relevent history reviewed, required imaging and test results available.  Performed  Maximum sterile technique was used including antiseptics, cap, gloves, gown, hand hygiene, mask and sheet. Skin prep: Chlorhexidine; local anesthetic administered 20 gauge catheter was inserted into right radial artery using the Seldinger technique.  Evaluation Blood flow good; BP tracing good. Complications: No apparent complications.   Sherry Strickland, Sherry Strickland William 12/18/2015

## 2015-12-18 NOTE — Progress Notes (Signed)
vLTM EEG running °

## 2015-12-18 NOTE — Progress Notes (Signed)
This note also relates to the following rows which could not be included: SpO2 - Cannot attach notes to unvalidated device data   After returning from Ct trip patients SATs remained in low 70's high 60's. Recruitment maneuver done and patient recovered quickly. Currently at 97% and doing well. Patient did not tolerate Ct trip well.

## 2015-12-19 ENCOUNTER — Inpatient Hospital Stay (HOSPITAL_COMMUNITY): Payer: Medicaid - Out of State

## 2015-12-19 DIAGNOSIS — G931 Anoxic brain damage, not elsewhere classified: Secondary | ICD-10-CM

## 2015-12-19 DIAGNOSIS — R7989 Other specified abnormal findings of blood chemistry: Secondary | ICD-10-CM

## 2015-12-19 DIAGNOSIS — I469 Cardiac arrest, cause unspecified: Secondary | ICD-10-CM

## 2015-12-19 DIAGNOSIS — J9601 Acute respiratory failure with hypoxia: Secondary | ICD-10-CM

## 2015-12-19 DIAGNOSIS — J189 Pneumonia, unspecified organism: Secondary | ICD-10-CM

## 2015-12-19 DIAGNOSIS — B20 Human immunodeficiency virus [HIV] disease: Secondary | ICD-10-CM

## 2015-12-19 LAB — POCT I-STAT 3, ART BLOOD GAS (G3+)
ACID-BASE DEFICIT: 12 mmol/L — AB (ref 0.0–2.0)
Acid-base deficit: 15 mmol/L — ABNORMAL HIGH (ref 0.0–2.0)
BICARBONATE: 14.4 meq/L — AB (ref 20.0–24.0)
BICARBONATE: 12.9 meq/L — AB (ref 20.0–24.0)
O2 SAT: 99 %
O2 Saturation: 99 %
PH ART: 7.209 — AB (ref 7.350–7.450)
PH ART: 7.123 — AB (ref 7.350–7.450)
PO2 ART: 169 mmHg — AB (ref 80.0–100.0)
PO2 ART: 206 mmHg — AB (ref 80.0–100.0)
Patient temperature: 38
TCO2: 15 mmol/L (ref 0–100)
TCO2: 14 mmol/L (ref 0–100)
pCO2 arterial: 36.6 mmHg (ref 35.0–45.0)
pCO2 arterial: 39.3 mmHg (ref 35.0–45.0)

## 2015-12-19 LAB — ECHOCARDIOGRAM COMPLETE
CHL CUP MV DEC (S): 187
EERAT: 7.32
EWDT: 187 ms
FS: 29 % (ref 28–44)
HEIGHTINCHES: 65 in
IVS/LV PW RATIO, ED: 1.04
LA diam end sys: 28 mm
LA diam index: 1.3 cm/m2
LASIZE: 28 mm
LAVOL: 46.3 mL
LAVOLA4C: 31.3 mL
LAVOLIN: 21.5 mL/m2
LV E/e' medial: 7.32
LV E/e'average: 7.32
LV PW d: 8.94 mm — AB (ref 0.6–1.1)
LV SIMPSON'S DISK: 41
LV dias vol: 100 mL (ref 46–106)
LV e' LATERAL: 8.81 cm/s
LV sys vol: 59 mL — AB (ref 14–42)
LVDIAVOLIN: 46 mL/m2
LVOT area: 3.46 cm2
LVOTD: 21 mm
LVSYSVOLIN: 27 mL/m2
Lateral S' vel: 10.1 cm/s
MV pk A vel: 30.6 m/s
MVPKEVEL: 64.5 m/s
RV TAPSE: 21.4 mm
Stroke v: 41 ml
TDI e' lateral: 8.81
TDI e' medial: 6.74
Weight: 3439.18 oz

## 2015-12-19 LAB — BASIC METABOLIC PANEL
ANION GAP: 14 (ref 5–15)
BUN: 58 mg/dL — AB (ref 6–20)
CALCIUM: 6.3 mg/dL — AB (ref 8.9–10.3)
CO2: 14 mmol/L — ABNORMAL LOW (ref 22–32)
Chloride: 111 mmol/L (ref 101–111)
Creatinine, Ser: 4.16 mg/dL — ABNORMAL HIGH (ref 0.44–1.00)
GFR, EST AFRICAN AMERICAN: 15 mL/min — AB (ref >60)
GFR, EST NON AFRICAN AMERICAN: 13 mL/min — AB (ref >60)
GLUCOSE: 92 mg/dL (ref 65–99)
POTASSIUM: 5.2 mmol/L — AB (ref 3.5–5.1)
SODIUM: 139 mmol/L (ref 135–145)

## 2015-12-19 LAB — CBC
HEMATOCRIT: 27.3 % — AB (ref 36.0–46.0)
Hemoglobin: 8.9 g/dL — ABNORMAL LOW (ref 12.0–15.0)
MCH: 32.4 pg (ref 26.0–34.0)
MCHC: 32.6 g/dL (ref 30.0–36.0)
MCV: 99.3 fL (ref 78.0–100.0)
Platelets: 62 10*3/uL — ABNORMAL LOW (ref 150–400)
RBC: 2.75 MIL/uL — ABNORMAL LOW (ref 3.87–5.11)
RDW: 21.7 % — AB (ref 11.5–15.5)
WBC: 15.1 10*3/uL — AB (ref 4.0–10.5)

## 2015-12-19 LAB — COMPREHENSIVE METABOLIC PANEL
ALT: 39 U/L (ref 14–54)
AST: 55 U/L — AB (ref 15–41)
Albumin: 2 g/dL — ABNORMAL LOW (ref 3.5–5.0)
Alkaline Phosphatase: 99 U/L (ref 38–126)
Anion gap: 15 (ref 5–15)
BILIRUBIN TOTAL: 1.6 mg/dL — AB (ref 0.3–1.2)
BUN: 55 mg/dL — AB (ref 6–20)
CO2: 14 mmol/L — ABNORMAL LOW (ref 22–32)
Calcium: 6.5 mg/dL — ABNORMAL LOW (ref 8.9–10.3)
Chloride: 111 mmol/L (ref 101–111)
Creatinine, Ser: 3.61 mg/dL — ABNORMAL HIGH (ref 0.44–1.00)
GFR, EST AFRICAN AMERICAN: 18 mL/min — AB (ref >60)
GFR, EST NON AFRICAN AMERICAN: 15 mL/min — AB (ref >60)
Glucose, Bld: 111 mg/dL — ABNORMAL HIGH (ref 65–99)
POTASSIUM: 4.7 mmol/L (ref 3.5–5.1)
Sodium: 140 mmol/L (ref 135–145)
TOTAL PROTEIN: 5.8 g/dL — AB (ref 6.5–8.1)

## 2015-12-19 LAB — GLUCOSE, CAPILLARY
GLUCOSE-CAPILLARY: 133 mg/dL — AB (ref 65–99)
GLUCOSE-CAPILLARY: 129 mg/dL — AB (ref 65–99)
GLUCOSE-CAPILLARY: 90 mg/dL (ref 65–99)
Glucose-Capillary: 94 mg/dL (ref 65–99)
Glucose-Capillary: 69 mg/dL (ref 65–99)
Glucose-Capillary: 99 mg/dL (ref 65–99)

## 2015-12-19 LAB — T-HELPER CELLS (CD4) COUNT (NOT AT ARMC): CD4 % Helper T Cell: <1 % — ABNORMAL LOW (ref 33–55)

## 2015-12-19 LAB — HEPARIN LEVEL (UNFRACTIONATED): Heparin Unfractionated: 0.38 IU/mL (ref 0.30–0.70)

## 2015-12-19 MED ORDER — DEXTROSE 50 % IV SOLN
INTRAVENOUS | Status: AC
  Administered 2015-12-19: 25 mL
  Filled 2015-12-19: qty 50

## 2015-12-19 MED ORDER — PIPERACILLIN-TAZOBACTAM IN DEX 2-0.25 GM/50ML IV SOLN
2.2500 g | Freq: Three times a day (TID) | INTRAVENOUS | Status: DC
  Administered 2015-12-19 – 2015-12-20 (×4): 2.25 g via INTRAVENOUS
  Filled 2015-12-19 (×6): qty 50

## 2015-12-19 MED ORDER — SODIUM BICARBONATE 8.4 % IV SOLN
INTRAVENOUS | Status: DC
  Administered 2015-12-19 – 2015-12-20 (×2): via INTRAVENOUS
  Filled 2015-12-19 (×6): qty 150

## 2015-12-19 MED ORDER — FAMOTIDINE IN NACL 20-0.9 MG/50ML-% IV SOLN
20.0000 mg | INTRAVENOUS | Status: DC
  Administered 2015-12-20: 20 mg via INTRAVENOUS
  Filled 2015-12-19: qty 50

## 2015-12-19 MED ORDER — DEXTROSE-NACL 5-0.9 % IV SOLN
INTRAVENOUS | Status: DC
Start: 2015-12-19 — End: 2015-12-19
  Administered 2015-12-19: 13:00:00 via INTRAVENOUS

## 2015-12-19 MED ORDER — DEXTROSE 5 % AND 0.9 % NACL IV BOLUS: 75.0000 mL | Freq: Once | INTRAVENOUS | Status: DC

## 2015-12-19 MED ORDER — SULFAMETHOXAZOLE-TRIMETHOPRIM 400-80 MG/5ML IV SOLN
480.0000 mg | Freq: Two times a day (BID) | INTRAVENOUS | Status: DC
  Administered 2015-12-19 – 2015-12-20 (×3): 480 mg via INTRAVENOUS
  Filled 2015-12-19 (×5): qty 30

## 2015-12-19 MED ORDER — SODIUM CHLORIDE 0.9 % IV SOLN: INTRAVENOUS | Status: DC

## 2015-12-19 NOTE — Progress Notes (Signed)
vLTM EEG complete. Results pending

## 2015-12-19 NOTE — Progress Notes (Signed)
Echocardiogram 2D Echocardiogram has been performed.  Marisue Humblelexis N Tyden Kann 12/19/2015, 2:13 PM

## 2015-12-19 NOTE — Progress Notes (Signed)
ANTICOAGULATION CONSULT NOTE - Follow-up Consult  Pharmacy Consult:  Heparin Indication:  Rule out PE  Allergies  Allergen Reactions  . Oxycodone Itching    Patient Measurements: Height: 5\' 5"  (165.1 cm) Weight: 214 lb 15.2 oz (97.5 kg) IBW/kg (Calculated) : 57  Ht: 64 in  IBW: 55 kg Heparin Dosing Weight: 75 kg  Vital Signs: Temp: 100.8 F (38.2 C) (06/19 0700) Temp Source: Core (Comment) (06/19 0600) BP: 110/74 mmHg (06/19 0735) Pulse Rate: 109 (06/19 0735)  Labs:  Recent Labs  12/18/15 0256 12/18/15 0445 12/18/15 1218 12/19/15 0400  HGB 9.9* 9.9*  --  8.9*  HCT 29.6* 29.9*  --  27.3*  PLT 94* 93*  --  62*  HEPARINUNFRC  --  0.32 0.49 0.38  CREATININE 2.14* 2.27*  --  3.61*  TROPONINI 2.83* 3.50* 2.06*  --     Estimated Creatinine Clearance: 25.1 mL/min (by C-G formula based on Cr of 3.61).   Height: 5\' 5"  (165.1 cm) Weight: 214 lb 15.2 oz (97.5 kg) IBW/kg (Calculated) : 57  Temp (24hrs), Avg:100.3 F (37.9 C), Min:98.6 F (37 C), Max:101.3 F (38.5 C)   Recent Labs Lab 12/18/15 0253 12/18/15 0256 12/18/15 0445 12/19/15 0400  WBC  --  16.7* 16.5* 15.1*  CREATININE  --  2.14* 2.27* 3.61*  LATICACIDVEN 2.3*  --   --   --     Estimated Creatinine Clearance: 25.1 mL/min (by C-G formula based on Cr of 3.61).      Assessment: 34 YOF on IV heparin for rule out PE.  Heparin level is therapeutic. Platelets were low on admission, now trending down at 62. H/H trending down too. No bleeding noted per Maylon PeppersKatrice, Charity fundraiserN.    Discussed platelets with Dr. Molli KnockYacoub who wants to continue treatment for now. Monitor closely for bleeding.    Goal of Therapy:  Heparin level 0.3-0.7 units/ml Monitor platelets by anticoagulation protocol: Yes     Plan:  - Continue heparin gtt at 1300 units/hr - Daily HL / CBC - Monitor closely for any signs of bleeding.   Link SnufferJessica Jasiri Hanawalt, PharmD, BCPS Clinical Pharmacist [phone] 12/19/2015, 8:47 AM

## 2015-12-19 NOTE — Significant Event (Signed)
   Hypoglycemic Event  CBG:69  Treatment: 25 ml D50  Symptoms: none  Follow-up CBG: Time" 1318  CBG Result:129  Possible Reasons for Event: unknown  Comments/MD notified: Dr Molli KnockYacoub notified; order received for hypoglycemia protocol and D5NS @ 75 ml/hr   Cyd SilenceKeller, Olanna Percifield Burnard BuntingKatrice      Sherial Ebrahim, Posey BoyerEdweana Katrice

## 2015-12-19 NOTE — Progress Notes (Signed)
Wasted 220 cc of fentanyl gtt  in the sink. Witnessed by Samuel GermanyS Whyte RN

## 2015-12-19 NOTE — Progress Notes (Signed)
PULMONARY / CRITICAL CARE MEDICINE   Name: Sherry Strickland MRN: [id number] DOB: 1980-12-29    ADMISSION DATE:  12/19/2015 CONSULTATION DATE:  12/02/2015  [facility]  [facility]  CHIEF COMPLAINT:  Cardiac arrest, pneumonia, shock  HISTORY OF PRESENT ILLNESS:   Sherry Strickland is a 35F with PMH significant for HIV who presented to the [facility] ED on 12/16/15 with complaints of shortness of breath, cough, and fevers. She was febrile, tachy, hypotensive, tachypneic and had a new oxygen requirement. Labs were notable for a metabolic acidosis, acute kidney injury, leukopenia (2.7), and mild elevations of alkP and AST. She was admitted for sepsis 2/2 multifocal pneumonia and started on rocephin and azithromycin. She has reportedly been on antiretroviral therapy with Reyataz, Truvada and Norvir.  Once admitted, her antibiotics were broadened to vanc, merrem, azithro. Crypto antigen was sent.  On 12/04/2015, she suffered an acute cardiac arrest at 12:55pm. ACLS was initiated and 2 rounds of CPR, 2 amps of EPI, 2 shocks (for VT and VF) and amiodarone were given. ROSC at 1:01pm. Bedside echo showed possible RV strain with TR concerning for possible acute PE and heparin gtt was initiated. She was started on pressors. Additional workup prior to transfer included a spot EEG, but no results were included in the record. CD4 count and viral load were reportedly sent at the outside hospital.   On arrival to the MICU, she is responsive only to painful stimuli. She continues on norepi, a bicarb gtt, and propofol for sedation. She exhibits intermittent twitching/jerking movements.   SUBJECTIVE:  Unresponsive, breathing over the vent.  VITAL SIGNS: BP 110/74 mmHg  Pulse 109  Temp(Src) 100.8 F (38.2 C) (Core (Comment))  Resp 36  Ht 5\' 5"  (1.651 m)  Wt 97.5 kg (214 lb 15.2 oz)  BMI 35.77 kg/m2  SpO2 100%  LMP  (LMP Unknown)  HEMODYNAMICS:    VENTILATOR SETTINGS: Vent Mode:  [-] PRVC FiO2  (%):  [90 %-100 %] 90 % Set Rate:  [35 bmp] 35 bmp Vt Set:  [500 mL] 500 mL PEEP:  [12 cmH20-14 cmH20] 14 cmH20 Plateau Pressure:  [37 cmH20] 37 cmH20  INTAKE / OUTPUT: I/O last 3 completed shifts: In: 4746.3 [I.V.:3310.8; NG/GT:90; IV Piggyback:1345.5] Out: 185 [Urine:185]  PHYSICAL EXAMINATION:  General Well nourished, well developed, obese, intubated, off sedation, unresponsive  HEENT Pupils 4 mm bilaterally and minimally responsive, no corneals, no dolls eye, no gag. ETT / OGT in place. Poor dentition.   Pulmonary Coarse bilaterally with ronchi on the right > left  Vent-assisted effort, symmetrical expansion.   Cardiovascular tachycardic, regular rhythm. S1, s2. No m/r/g. Distal pulses palpable.  Abdomen Obese, mildly distended, positive bowel sounds, no palpable organomegaly or masses. Normoresonant to percussion.  Musculoskeletal Grossly normal   Lymphatics No cervical, supraclavicular or axillary adenopathy.   Neurologic Grossly intact. No focal deficits.   Skin/Integuement No rash, no cyanosis, no clubbing. Multiple tattoos.    LABS: see care everywhere for outside hospital results.  BMET  Recent Labs Lab 12/18/15 0256 12/18/15 0445 12/19/15 0400  NA 145 145 140  K 3.7 4.2 4.7  CL 115* 112* 111  CO2 20* 20* 14*  BUN 38* 40* 55*  CREATININE 2.14* 2.27* 3.61*  GLUCOSE 176* 189* 111*   Electrolytes  Recent Labs Lab 12/18/15 0256 12/18/15 0445 12/19/15 0400  CALCIUM 6.6* 6.6* 6.5*  MG  --  2.3  --   PHOS  --  6.2*  --    CBC  Recent Labs Lab 12/18/15 0256 12/18/15 0445 12/19/15 0400  WBC 16.7* 16.5* 15.1*  HGB 9.9* 9.9* 8.9*  HCT 29.6* 29.9* 27.3*  PLT 94* 93* 62*   Coag's No results for input(s): APTT, INR in the last 168 hours.  Sepsis Markers  Recent Labs Lab 12/18/15 0253  LATICACIDVEN 2.3*   ABG  Recent Labs Lab 12/18/15 0404 12/18/15 0657 12/19/15 0325  PHART 7.163* 7.230* 7.209*  PCO2ART 58.4* 47.0* 36.6  PO2ART 62.3* 86.3  169.0*   Liver Enzymes  Recent Labs Lab 12/18/15 0256 12/19/15 0400  AST 80* 55*  ALT 49 39  ALKPHOS 107 99  BILITOT 2.0* 1.6*  ALBUMIN 2.0* 2.0*   Cardiac Enzymes  Recent Labs Lab 12/18/15 0256 12/18/15 0445 12/18/15 1218  TROPONINI 2.83* 3.50* 2.06*   Glucose  Recent Labs Lab 12/18/15 0818 12/18/15 1133 12/18/15 1559 12/18/15 2056 12/18/15 2319 12/19/15 0406  GLUCAP 172* 195* 199* 187* 133* 94   Imaging Dg Chest Port 1 View  12/19/2015  CLINICAL DATA:  35 year old female with a history of pneumonia and endotracheal tube placement EXAM: PORTABLE CHEST 1 VIEW COMPARISON:  12/18/2015, 2016-02-21, 07/05/2015 FINDINGS: Cardiomediastinal silhouette unchanged. Endotracheal tube remains in position terminate the level of the clavicular heads. Terminates approximately 6 cm above the carina. Gastric tube projects over the mediastinum terminating out of the field of view. Unchanged position of right IJ approach central venous catheter which terminates in the superior vena cava. Improving aeration of the right upper lobe compared to the prior, with persisting mixed interstitial and airspace opacities bilaterally. No visualized pneumothorax. No large pleural effusion. IMPRESSION: Unchanged position of the endotracheal tube which terminates suitably above the carina. Otherwise unchanged gastric tube and right IJ central venous catheter. Persisting bilateral, right greater than left airspace opacity potentially a combination of infection and/ or edema. Aeration is improved at the right upper lobe, potentially related to the respiratory cycle on the ventilator, or improving disease. Signed, Yvone NeuJaime S. Loreta AveWagner, DO [facility] Electronically Signed   By: Gilmer MorJaime  Wagner D.O.   On: 12/19/2015 06:55   Dg Chest Port 1 View  12/18/2015  CLINICAL DATA:  Decreased oxygen saturation.  In patient. EXAM: PORTABLE CHEST 1 VIEW COMPARISON:   2016-02-21 FINDINGS: Focal airspace consolidation in the right upper lobe abutting the minor fissure is similar to the previous exam. There is improved aeration with decreased airspace opacity throughout the left lung and in the right lower lung. There are no new areas of lung opacification. Probable small pleural effusions.  No evidence of a pneumothorax. Endotracheal tube, right internal jugular central venous line and nasal/orogastric tube are stable and well positioned. IMPRESSION: 1. Despite the history, lung aeration appears improved since the previous day's exam. There are no new abnormalities. 2. Support apparatus is stable and well positioned. Electronically Signed   By: Amie Portlandavid  Ormond M.D.   On: 12/18/2015 10:45   STUDIES:  CT Chest WO 12/16/15 Impression:  1. Large masslike consolidation in the right upper lobe with smaller consolidation in the right lower lobe and clustered centrilobular nodules in the right middle lobe concerning for multifocal pneumonia. Recommend follow up to radiographic resolution .  TTE 2015-10-28 There is normal left ventricular wall thickness.There is mild to moderate global hypokinesis of the left ventricle. Ejection Fraction = 35-45%. The right ventricle is mildly dilated. The right ventricular systolic function is mildly reduced. There is mild mitral regurgitation. There is mild to moderate tricuspid regurgitation. Right ventricular systolic  pressure is elevated at 30-7mmHg.  EEG 6/17: Interpretation: Severe suppressed and significant diffuse slowing background indicates severe generalized neurophysiological disturbance. The diffuse slowing can be seen in patients with  toxic or metabolic disorders, neurodegenerative disorders, or recent seizure. Again, intermittent muscle contraction artifacts from myoclonus was observed without epileptiform discharges correlation.   CULTURES: Blood OSH 6/16 >> Blood 6/17 >> Urine OSH 6/16 >> Crypto Ag >>  negative  ANTIBIOTICS: Vanc / Merrem / Azithro 6/16 >> 6/17 Vanc / zosyn / bactrim 6/17 >>  SIGNIFICANT EVENTS: 6/17 intubated post arrest in [place].  LINES/TUBES: RIJ 6/17 >> ETT 6/17 >> Foley 6/17 >>  DISCUSSION:  Ms. Platas is a 62 F who presented to an outside hospital with sepsis thought secondary to multifocal pneumonia. She then suffered a cardiac arrest with documentation of both VT and VF. Post-code TTE was suggestive of RH strain and empiric heparin was initiated. Other significant findings include UDS positive for amphetamine and benzos, acute kidney injury (baseline unknown), myoclonic jerks on exam and outside EEG, systolic dysfunction with EF 20-25% and underlying HIV/AIDS. Will cont current supportive care including abx, pressors, and vent support. Hope we can cont to wean FIO2, peep and gtt needs. Next of concern will be the results of pending EEG and f/u on the potential of HIE.   ASSESSMENT / PLAN:  PULMONARY A: Acute hypoxemic respiratory failure Multifocal pneumonia Concern for pulmonary embolism P:   CXR in AM Ventilatory support; no weaning given neuro status Continue empiric antibiotics with vanc / zosyn / bactrim Continue empiric heparin for now Titrate FiO2 and PEEP as able.  CARDIOVASCULAR A:  Cardiac arrest (VT/VF) Shock (cardiogenic vs distributive)-->pressor needs improving Systolic dysfunction EF 20-25% (chronicity unknown) RV dilation w/ reduced systolic function P:  D/C amiodarone D/C norepi Trend lactate Continue empiric heparin gtt  RENAL A:   Acute kidney injury (baseline unknown) 3.53 to 2.04 at OSH; 1.03 in 07/2014 Metabolic acidosis (improved stable off bicarb gttt) P:   Maintain MAP > 65. D/C IVFs. Trend Cr, lytes. Repeat labs here. Avoid nephrotoxins. Replace electrolytes as indicated.  GASTROINTESTINAL A:   No acute issues P:   Consult nutrition for TF as per nutrition. GI ppx   HEMATOLOGIC A:   Concern for  acute PE P:  Continue heparin for now. CBC in AM.  INFECTIOUS A:   Multifocal pneumonia; risk factors for PCP P:   Continue empiric vanc/zosyn/bactrim. Follow outside cultures. Resent blood cultures, tracheal aspirate. Order CD4 (pending) and viral load high. ID consult called.  ENDOCRINE A:   Hyperglycemia - likely iatrogenic from D5 gtt   P:   CBG / SSI as needed  NEUROLOGIC A:   Altered mental status with myoclonic jerks; etiology unclear but concerning for HIE P:   RASS goal: 0. Neurology input appreciated, if not change by tomorrow then prognosis is very poor. Keppra initiated empirically. EEG per neuro.  FAMILY  - Updates: Called fiance, informed of poor prognosis and asked to gather family for a family meeting.  The patient is critically ill with multiple organ systems failure and requires high complexity decision making for assessment and support, frequent evaluation and titration of therapies, application of advanced monitoring technologies and extensive interpretation of multiple databases.   Critical Care Time devoted to patient care services described in this note is  35  Minutes. This time reflects time of care of this signee Dr Koren Bound. This critical care time does not reflect procedure time, or teaching time or  supervisory time of PA/NP/Med student/Med Resident etc but could involve care discussion time.  Alyson Reedy, M.D. [facility] Pulmonary/Critical Care Medicine. Pager: [phone]. After hours pager: [phone].  12/19/2015, 8:16 AM

## 2015-12-19 NOTE — Consult Note (Addendum)
[facility]  Total days of antibiotics 3        Day 3 piptazo        Day 3 bactrim        Day 3 vanco       Reason for Consult: hiv disease, anoxic brain injury    Referring Physician: yacoub  Active Problems:   Pneumonia   Cardiac arrest ([place])   Acute respiratory failure with hypoxia ([place])   Anoxic brain injury ([place])    HPI: Sherry Strickland. is a 35 y.o. 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 ACLS was initiated and 2 rounds of CPR, 2 amps of EPI, 2 shocks (for VT and VF) and amiodarone were given. ROSC at 1:01pm. Bedside echo showed possible RV strain with TR concerning for possible acute PE and heparin gtt was initiated. She was started on pressors. Neurologic exam is very limited, she is responsive only to painful stimuli. She continues on norepi, a bicarb gtt, and propofol for sedation. She exhibits intermittent twitching/jerking movements. Neurology involved also feel poor prognosis due to anoxic brain injury. Family would like to continue full support over the next few days to see if any improvement. On admit, she was febrile up to 103F, leukocytosis of 16K, CXr shows RUL infiltrate per my read.  Her mother is at her bedside who reports she took a bunch of nerve pills and "snorting drugs" in the days prior to admit. It doesn't appear that mother may know about the patient's HIV status  Past Medical History  Diagnosis Date  . HIV (human immunodeficiency virus infection)   . Pancreatitis     Allergies:  Allergies  Allergen Reactions  . Oxycodone Itching   MEDICATIONS: . antiseptic oral rinse  7 mL Mouth Rinse QID  . chlorhexidine gluconate (SAGE KIT)  15 mL Mouth Rinse BID  . famotidine (PEPCID) IV  20 mg Intravenous Q12H  . insulin aspart  2-6 Units  Subcutaneous Q4H  . levETIRAcetam  750 mg Intravenous Q12H  . piperacillin-tazobactam (ZOSYN)  IV  2.25 g Intravenous Q8H  . sulfamethoxazole-trimethoprim  480 mg Intravenous Q12H    Social History  Substance Use Topics  . Smoking status: Current Every Day Smoker  . Smokeless tobacco: Not on file  . Alcohol Use: No     Comment: Quit drinking two months ago in october 2015.    Family History  Problem Relation Age of Onset  . Bipolar disorder Mother     Review of Systems -  Unable to obtain since patient is obtunded  OBJECTIVE: Temp:  [98.6 F (37 C)-100.8 F (38.2 C)] 100.8 F (38.2 C) (06/19 0700) Pulse Rate:  [26-116] 109 (06/19 0735) Resp:  [20-39] 36 (06/19 0735) BP: (89-132)/(61-80) 110/74 mmHg (06/19 0735) SpO2:  [83 %-100 %] 100 % (06/19 0735) Arterial Line BP: (105-133)/(57-75) 128/67 mmHg (06/19 0700) FiO2 (%):  [90 %-100 %] 90 % (06/19 0800) Weight:  [214 lb 15.2 oz (97.5 kg)] 214 lb 15.2 oz (97.5 kg) (06/19 0400) Physical Exam  Constitutional:  Intubated and sedated. appears well-developed and well-nourished. No distress.  HENT: [place]/AT, PERRLA, no scleral icterus Mouth/Throat: ETT tube in place Cardiovascular: tachycardic, regular rhythm and normal heart sounds. Exam reveals no gallop and no friction rub.  No murmur heard.  Pulmonary/Chest: Effort  normal and breath sounds normal. No respiratory distress.  has no wheezes.  Neck = supple,  Abdominal: Soft. Bowel sounds are normal.  exhibits no distension. There is no tenderness.  Neurological: alert and oriented to person, place, and time.  Skin: Skin is warm and dry. No rash noted. No erythema. Numerous tattoos   LABS: Results for orders placed or performed during the hospital encounter of 12/06/2015 (from the past 48 hour(s))  MRSA PCR Screening     Status: None   Collection Time: 12/05/2015  9:49 PM  Result Value Ref Range   MRSA by PCR NEGATIVE NEGATIVE    Comment:        The GeneXpert MRSA Assay  (FDA approved for NASAL specimens only), is one component of a comprehensive MRSA colonization surveillance program. It is not intended to diagnose MRSA infection nor to guide or monitor treatment for MRSA infections.   Glucose, capillary     Status: Abnormal   Collection Time: 12/16/2015 10:09 PM  Result Value Ref Range   Glucose-Capillary 187 (H) 65 - 99 mg/dL  Blood gas, arterial     Status: Abnormal   Collection Time: 12/16/2015 10:24 PM  Result Value Ref Range   FIO2 1.00    Delivery systems VENTILATOR    Mode PRESSURE REGULATED VOLUME CONTROL    VT 520 mL   LHR 18 resp/min   Peep/cpap 10.0 cm H20   pH, Arterial 7.331 (L) 7.350 - 7.450   pCO2 arterial 38.0 35.0 - 45.0 mmHg   pO2, Arterial 45.7 (L) 80.0 - 100.0 mmHg   Bicarbonate 19.3 (L) 20.0 - 24.0 mEq/L   TCO2 20.4 0 - 100 mmol/L   Acid-base deficit 5.4 (H) 0.0 - 2.0 mmol/L   O2 Saturation 75.1 %   Patient temperature 100.3    Collection site LEFT RADIAL    Drawn by [phone]    Sample type ARTERIAL DRAW    Allens test (pass/fail) PASS PASS  Glucose, capillary     Status: Abnormal   Collection Time: 12/12/2015 11:10 PM  Result Value Ref Range   Glucose-Capillary 138 (H) 65 - 99 mg/dL   Comment 1 Notify RN    Comment 2 Document in Chart   Triglycerides     Status: Abnormal   Collection Time: 12/18/15 12:30 AM  Result Value Ref Range   Triglycerides 413 (H) <150 mg/dL  Blood gas, arterial     Status: Abnormal   Collection Time: 12/18/15  1:20 AM  Result Value Ref Range   FIO2 1.00    Delivery systems VENTILATOR    Mode PRESSURE REGULATED VOLUME CONTROL    VT 460 mL   LHR 30 resp/min   Peep/cpap 14.0 cm H20   pH, Arterial 7.260 (L) 7.350 - 7.450   pCO2 arterial 46.8 (H) 35.0 - 45.0 mmHg   pO2, Arterial 46.7 (L) 80.0 - 100.0 mmHg   Bicarbonate 20.2 20.0 - 24.0 mEq/L   TCO2 21.6 0 - 100 mmol/L   Acid-base deficit 5.6 (H) 0.0 - 2.0 mmol/L   O2 Saturation 73.4 %   Patient temperature 99.0    Collection site A-LINE     Drawn by [phone]    Sample type ARTERIAL DRAW   Lactic acid, plasma     Status: Abnormal   Collection Time: 12/18/15  2:53 AM  Result Value Ref Range   Lactic Acid, Venous 2.3 (HH) 0.5 - 2.0 mmol/L    Comment: CRITICAL RESULT CALLED TO, READ BACK BY AND VERIFIED  WITH: [facility] RN 12/18/2015 0322 JORDANS   CBC     Status: Abnormal   Collection Time: 12/18/15  2:56 AM  Result Value Ref Range   WBC 16.7 (H) 4.0 - 10.5 K/uL    Comment: WHITE COUNT CONFIRMED ON SMEAR   RBC 2.96 (L) 3.87 - 5.11 MIL/uL   Hemoglobin 9.9 (L) 12.0 - 15.0 g/dL   HCT 29.6 (L) 36.0 - 46.0 %   MCV 100.0 78.0 - 100.0 fL   MCH 33.4 26.0 - 34.0 pg   MCHC 33.4 30.0 - 36.0 g/dL   RDW 22.4 (H) 11.5 - 15.5 %   Platelets 94 (L) 150 - 400 K/uL    Comment: PLATELET COUNT CONFIRMED BY SMEAR  Comprehensive metabolic panel     Status: Abnormal   Collection Time: 12/18/15  2:56 AM  Result Value Ref Range   Sodium 145 135 - 145 mmol/L   Potassium 3.7 3.5 - 5.1 mmol/L   Chloride 115 (H) 101 - 111 mmol/L   CO2 20 (L) 22 - 32 mmol/L   Glucose, Bld 176 (H) 65 - 99 mg/dL   BUN 38 (H) 6 - 20 mg/dL   Creatinine, Ser 2.14 (H) 0.44 - 1.00 mg/dL   Calcium 6.6 (L) 8.9 - 10.3 mg/dL   Total Protein 5.7 (L) 6.5 - 8.1 g/dL   Albumin 2.0 (L) 3.5 - 5.0 g/dL   AST 80 (H) 15 - 41 U/L   ALT 49 14 - 54 U/L   Alkaline Phosphatase 107 38 - 126 U/L   Total Bilirubin 2.0 (H) 0.3 - 1.2 mg/dL   GFR calc non Af Amer 29 (L) >60 mL/min   GFR calc Af Amer 33 (L) >60 mL/min    Comment: (NOTE) The eGFR has been calculated using the CKD EPI equation. This calculation has not been validated in all clinical situations. eGFR's persistently <60 mL/min signify possible Chronic Kidney Disease.    Anion gap 10 5 - 15  Troponin I (q 6hr x 3)     Status: Abnormal   Collection Time: 12/18/15  2:56 AM  Result Value Ref Range   Troponin I 2.83 (HH) <0.031 ng/mL    Comment:        POSSIBLE MYOCARDIAL ISCHEMIA. SERIAL TESTING RECOMMENDED. CRITICAL  RESULT CALLED TO, READ BACK BY AND VERIFIED WITH: [facility] RN 12/18/2015 0319 JORDANS   Blood gas, arterial     Status: Abnormal   Collection Time: 12/18/15  4:04 AM  Result Value Ref Range   FIO2 1.00    Delivery systems VENTILATOR    Mode PRESSURE REGULATED VOLUME CONTROL    VT 460 mL   LHR 30 resp/min   Peep/cpap 12.0 cm H20   pH, Arterial 7.163 (LL) 7.350 - 7.450    Comment: CRITICAL RESULT CALLED TO, READ BACK BY AND VERIFIED WITH: CHRIS HAYS,RN AT 0413 BY FRANK MIKE RCP,RRT ON 12/18/15    pCO2 arterial 58.4 (HH) 35.0 - 45.0 mmHg    Comment: CRITICAL RESULT CALLED TO, READ BACK BY AND VERIFIED WITH: CHRIS HAYS,RN AT 0413 BY FRANK MIKE RCP,RRT ON 12/18/2015    pO2, Arterial 62.3 (L) 80.0 - 100.0 mmHg   Bicarbonate 19.4 (L) 20.0 - 24.0 mEq/L   TCO2 21.1 0 - 100 mmol/L   Acid-base deficit 7.4 (H) 0.0 - 2.0 mmol/L   O2 Saturation 78.8 %   Patient temperature 102.2    Collection site A-LINE    Drawn by [phone]    Sample type ARTERIAL DRAW  Allens test (pass/fail) PASS PASS  Glucose, capillary     Status: Abnormal   Collection Time: 12/18/15  4:44 AM  Result Value Ref Range   Glucose-Capillary 194 (H) 65 - 99 mg/dL  Basic metabolic panel     Status: Abnormal   Collection Time: 12/18/15  4:45 AM  Result Value Ref Range   Sodium 145 135 - 145 mmol/L   Potassium 4.2 3.5 - 5.1 mmol/L   Chloride 112 (H) 101 - 111 mmol/L   CO2 20 (L) 22 - 32 mmol/L   Glucose, Bld 189 (H) 65 - 99 mg/dL   BUN 40 (H) 6 - 20 mg/dL   Creatinine, Ser 2.27 (H) 0.44 - 1.00 mg/dL   Calcium 6.6 (L) 8.9 - 10.3 mg/dL   GFR calc non Af Amer 27 (L) >60 mL/min   GFR calc Af Amer 31 (L) >60 mL/min    Comment: (NOTE) The eGFR has been calculated using the CKD EPI equation. This calculation has not been validated in all clinical situations. eGFR's persistently <60 mL/min signify possible Chronic Kidney Disease.    Anion gap 13 5 - 15  Magnesium     Status: None   Collection Time: 12/18/15  4:45 AM    Result Value Ref Range   Magnesium 2.3 1.7 - 2.4 mg/dL  Phosphorus     Status: Abnormal   Collection Time: 12/18/15  4:45 AM  Result Value Ref Range   Phosphorus 6.2 (H) 2.5 - 4.6 mg/dL  Troponin I (q 6hr x 3)     Status: Abnormal   Collection Time: 12/18/15  4:45 AM  Result Value Ref Range   Troponin I 3.50 (HH) <0.031 ng/mL    Comment:        POSSIBLE MYOCARDIAL ISCHEMIA. SERIAL TESTING RECOMMENDED. CRITICAL VALUE NOTED.  VALUE IS CONSISTENT WITH PREVIOUSLY REPORTED AND CALLED VALUE.   Heparin level (unfractionated)     Status: None   Collection Time: 12/18/15  4:45 AM  Result Value Ref Range   Heparin Unfractionated 0.32 0.30 - 0.70 IU/mL    Comment:        IF HEPARIN RESULTS ARE BELOW EXPECTED VALUES, AND PATIENT DOSAGE HAS BEEN CONFIRMED, SUGGEST FOLLOW UP TESTING OF ANTITHROMBIN III LEVELS.   CBC     Status: Abnormal   Collection Time: 12/18/15  4:45 AM  Result Value Ref Range   WBC 16.5 (H) 4.0 - 10.5 K/uL   RBC 3.01 (L) 3.87 - 5.11 MIL/uL   Hemoglobin 9.9 (L) 12.0 - 15.0 g/dL   HCT 29.9 (L) 36.0 - 46.0 %   MCV 99.3 78.0 - 100.0 fL   MCH 32.9 26.0 - 34.0 pg   MCHC 33.1 30.0 - 36.0 g/dL   RDW 22.4 (H) 11.5 - 15.5 %   Platelets 93 (L) 150 - 400 K/uL    Comment: CONSISTENT WITH PREVIOUS RESULT  Blood gas, arterial     Status: Abnormal   Collection Time: 12/18/15  6:57 AM  Result Value Ref Range   FIO2 1.00    Delivery systems VENTILATOR    Mode PRESSURE REGULATED VOLUME CONTROL    VT 500 mL   LHR 35 resp/min   Peep/cpap 12.0 cm H20   pH, Arterial 7.230 (L) 7.350 - 7.450   pCO2 arterial 47.0 (H) 35.0 - 45.0 mmHg   pO2, Arterial 86.3 80.0 - 100.0 mmHg   Bicarbonate 18.2 (L) 20.0 - 24.0 mEq/L   TCO2 19.5 0 - 100 mmol/L   Acid-base deficit  7.5 (H) 0.0 - 2.0 mmol/L   O2 Saturation 92.1 %   Patient temperature 103.0    Collection site A-LINE    Drawn by [phone]    Sample type ARTERIAL DRAW    Allens test (pass/fail) PASS PASS  Glucose, capillary     Status:  Abnormal   Collection Time: 12/18/15  8:18 AM  Result Value Ref Range   Glucose-Capillary 172 (H) 65 - 99 mg/dL  Culture, respiratory (NON-Expectorated)     Status: None (Preliminary result)   Collection Time: 12/18/15 10:55 AM  Result Value Ref Range   Specimen Description TRACHEAL ASPIRATE    Special Requests Immunocompromised    Gram Stain      RARE WBC PRESENT, PREDOMINANTLY MONONUCLEAR NO ORGANISMS SEEN GRAM STAIN REVIEWED-AGREE WITH RESULT V WILKINS    Culture PENDING    Report Status PENDING   Glucose, capillary     Status: Abnormal   Collection Time: 12/18/15 11:33 AM  Result Value Ref Range   Glucose-Capillary 195 (H) 65 - 99 mg/dL  Troponin I (q 6hr x 3)     Status: Abnormal   Collection Time: 12/18/15 12:18 PM  Result Value Ref Range   Troponin I 2.06 (HH) <0.031 ng/mL    Comment:        POSSIBLE MYOCARDIAL ISCHEMIA. SERIAL TESTING RECOMMENDED. CRITICAL VALUE NOTED.  VALUE IS CONSISTENT WITH PREVIOUSLY REPORTED AND CALLED VALUE.   Heparin level (unfractionated)     Status: None   Collection Time: 12/18/15 12:18 PM  Result Value Ref Range   Heparin Unfractionated 0.49 0.30 - 0.70 IU/mL    Comment:        IF HEPARIN RESULTS ARE BELOW EXPECTED VALUES, AND PATIENT DOSAGE HAS BEEN CONFIRMED, SUGGEST FOLLOW UP TESTING OF ANTITHROMBIN III LEVELS.   Glucose, capillary     Status: Abnormal   Collection Time: 12/18/15  3:59 PM  Result Value Ref Range   Glucose-Capillary 199 (H) 65 - 99 mg/dL   Comment 1 Notify RN    Comment 2 Document in Chart   Glucose, capillary     Status: Abnormal   Collection Time: 12/18/15  8:56 PM  Result Value Ref Range   Glucose-Capillary 187 (H) 65 - 99 mg/dL   Comment 1 Notify RN    Comment 2 Document in Chart   Glucose, capillary     Status: Abnormal   Collection Time: 12/18/15 11:19 PM  Result Value Ref Range   Glucose-Capillary 133 (H) 65 - 99 mg/dL   Comment 1 Notify RN    Comment 2 Document in Chart   I-STAT 3, arterial  blood gas (G3+)     Status: Abnormal   Collection Time: 12/19/15  3:25 AM  Result Value Ref Range   pH, Arterial 7.209 (L) 7.350 - 7.450   pCO2 arterial 36.6 35.0 - 45.0 mmHg   pO2, Arterial 169.0 (H) 80.0 - 100.0 mmHg   Bicarbonate 14.4 (L) 20.0 - 24.0 mEq/L   TCO2 15 0 - 100 mmol/L   O2 Saturation 99.0 %   Acid-base deficit 12.0 (H) 0.0 - 2.0 mmol/L   Patient temperature 38.0 C    Collection site ARTERIAL LINE    Drawn by RT    Sample type ARTERIAL   Heparin level (unfractionated)     Status: None   Collection Time: 12/19/15  4:00 AM  Result Value Ref Range   Heparin Unfractionated 0.38 0.30 - 0.70 IU/mL    Comment:  IF HEPARIN RESULTS ARE BELOW EXPECTED VALUES, AND PATIENT DOSAGE HAS BEEN CONFIRMED, SUGGEST FOLLOW UP TESTING OF ANTITHROMBIN III LEVELS.   CBC     Status: Abnormal   Collection Time: 12/19/15  4:00 AM  Result Value Ref Range   WBC 15.1 (H) 4.0 - 10.5 K/uL   RBC 2.75 (L) 3.87 - 5.11 MIL/uL   Hemoglobin 8.9 (L) 12.0 - 15.0 g/dL   HCT 27.3 (L) 36.0 - 46.0 %   MCV 99.3 78.0 - 100.0 fL   MCH 32.4 26.0 - 34.0 pg   MCHC 32.6 30.0 - 36.0 g/dL   RDW 21.7 (H) 11.5 - 15.5 %   Platelets 62 (L) 150 - 400 K/uL    Comment: CONSISTENT WITH PREVIOUS RESULT  Comprehensive metabolic panel     Status: Abnormal   Collection Time: 12/19/15  4:00 AM  Result Value Ref Range   Sodium 140 135 - 145 mmol/L   Potassium 4.7 3.5 - 5.1 mmol/L   Chloride 111 101 - 111 mmol/L   CO2 14 (L) 22 - 32 mmol/L   Glucose, Bld 111 (H) 65 - 99 mg/dL   BUN 55 (H) 6 - 20 mg/dL   Creatinine, Ser 3.61 (H) 0.44 - 1.00 mg/dL    Comment: DELTA CHECK NOTED   Calcium 6.5 (L) 8.9 - 10.3 mg/dL   Total Protein 5.8 (L) 6.5 - 8.1 g/dL   Albumin 2.0 (L) 3.5 - 5.0 g/dL   AST 55 (H) 15 - 41 U/L   ALT 39 14 - 54 U/L   Alkaline Phosphatase 99 38 - 126 U/L   Total Bilirubin 1.6 (H) 0.3 - 1.2 mg/dL   GFR calc non Af Amer 15 (L) >60 mL/min   GFR calc Af Amer 18 (L) >60 mL/min    Comment: (NOTE) The  eGFR has been calculated using the CKD EPI equation. This calculation has not been validated in all clinical situations. eGFR's persistently <60 mL/min signify possible Chronic Kidney Disease.    Anion gap 15 5 - 15  Glucose, capillary     Status: None   Collection Time: 12/19/15  4:06 AM  Result Value Ref Range   Glucose-Capillary 94 65 - 99 mg/dL    MICRO:  IMAGING: Ct Head Wo Contrast  12/18/2015  CLINICAL DATA:  Myoclonus.  Post cardiac arrest. EXAM: CT HEAD WITHOUT CONTRAST TECHNIQUE: Contiguous axial images were obtained from the base of the skull through the vertex without intravenous contrast. COMPARISON:  None. FINDINGS: Brain: No intracranial hemorrhage, mass effect, or midline shift. No evidence of cerebral edema. No hydrocephalus. The basilar cisterns are patent. No evidence of territorial infarct. No intracranial fluid collection. Vascular: No hyperdense vessel or abnormal calcification. Skull:  Calvarium is intact. Sinuses/Orbits: Mucosal thickening and opacity in the right side of sphenoid sinus, scattered mucosal thickening throughout the ethmoid air cells. Mastoid air cells are well aerated. Other: None. IMPRESSION: No acute intracranial abnormality. Electronically Signed   By: Jeb Levering M.D.   On: 12/18/2015 01:02   Dg Chest Port 1 View  12/19/2015  CLINICAL DATA:  35 year old female with a history of pneumonia and endotracheal tube placement EXAM: PORTABLE CHEST 1 VIEW COMPARISON:  12/18/2015, 12/23/2015, 07/05/2015 FINDINGS: Cardiomediastinal silhouette unchanged. Endotracheal tube remains in position terminate the level of the clavicular heads. Terminates approximately 6 cm above the carina. Gastric tube projects over the mediastinum terminating out of the field of view. Unchanged position of right IJ approach central venous catheter which terminates in the  superior vena cava. Improving aeration of the right upper lobe compared to the prior, with persisting mixed  interstitial and airspace opacities bilaterally. No visualized pneumothorax. No large pleural effusion. IMPRESSION: Unchanged position of the endotracheal tube which terminates suitably above the carina. Otherwise unchanged gastric tube and right IJ central venous catheter. Persisting bilateral, right greater than left airspace opacity potentially a combination of infection and/ or edema. Aeration is improved at the right upper lobe, potentially related to the respiratory cycle on the ventilator, or improving disease. Signed, Dulcy Fanny. Earleen Newport, DO [facility] Electronically Signed   By: Corrie Mckusick D.O.   On: 12/19/2015 06:55   Dg Chest Port 1 View  12/18/2015  CLINICAL DATA:  Decreased oxygen saturation.  In patient. EXAM: PORTABLE CHEST 1 VIEW COMPARISON:  12/21/2015 FINDINGS: Focal airspace consolidation in the right upper lobe abutting the minor fissure is similar to the previous exam. There is improved aeration with decreased airspace opacity throughout the left lung and in the right lower lung. There are no new areas of lung opacification. Probable small pleural effusions.  No evidence of a pneumothorax. Endotracheal tube, right internal jugular central venous line and nasal/orogastric tube are stable and well positioned. IMPRESSION: 1. Despite the history, lung aeration appears improved since the previous day's exam. There are no new abnormalities. 2. Support apparatus is stable and well positioned. Electronically Signed   By: Lajean Manes M.D.   On: 12/18/2015 10:45   Dg Chest Port 1 View  12/07/2015  CLINICAL DATA:  Myoclonus.  Tube placement. EXAM: PORTABLE CHEST 1 VIEW COMPARISON:  07/05/2015 FINDINGS: Endotracheal tube tip projects 3.8 cm above the car melena. Nasal/ orogastric tube passes well below the diaphragm into the stomach. Right internal jugular central venous line tip projects in the mid superior vena cava. There are bilateral  airspace opacities with evidence of a small right pleural effusion. No pneumothorax. Cardiac silhouette is normal in size. No mediastinal or hilar masses. IMPRESSION: 1. Lines and tubes well positioned as described.  No pneumothorax. 2. Bilateral airspace lung opacities and small right pleural effusion. Findings may reflect pulmonary edema or bilateral pneumonia. Electronically Signed   By: Lajean Manes M.D.   On: 12/16/2015 23:21    Assessment/Plan:  35yo F with poorly controlled HIV disease, possibly on truvada-DRVr. 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  Anoxic brain injury = discussed with her mother what that meant in regards to the patient's overall function. We mentioned that we will continue to follow neuro exam, repeat EEG to see if any improvement. I was very direct in mentioning that she has very little brain function only to keep cardiovascular system and that improvement to her function on her own is unlikely.  hiv disease= if mother decides to still pursue full support, can do truvada and tivicay per NG.  Pneumonia = possibly PCP vs. Aspiration. Continue with bactrim and piptazo. No need for vancomycin since mrsa colonization is negative  Raenell Mensing B. [place] [facility] [phone]

## 2015-12-19 NOTE — Progress Notes (Signed)
Subjective: No events overnight.   EEG with burst suppression  Exam: Filed Vitals:   12/19/15 0700 12/19/15 0735  BP: 110/74 110/74  Pulse: 45 109  Temp: 100.8 F (38.2 C)   Resp: 31 36   Gen: In bed, intubated Resp: ventilated Abd: soft, nt  Neuro: MS: partial eye opening to noxious stimuli. Does not follow commands CN: PERRL, corneals absent bilaterally.  Motor: No motor response to noxious stimuli.  Sensory:as above.   Pertinent Labs: Elevated creatinine  Impression: 35 yo F s/p cardiac arrest with multiple poor prognostic indicators. If she continues her current clinical exam at 72 hours, then this would be suggestive of poor prognosis. Especially absent hypothermia with a dismal exam, I suspect that she has had a catastrophic cerebral injury.   Recommendations: 1) Will reassess in the am 2) Can d/c EEG  3) continue keppra, f/u formal read of EEG  Ritta SlotMcNeill Kirkpatrick, MD [facility] [phone]  If 7pm- 7am, please page neurology on call as listed in AMION.

## 2015-12-19 NOTE — Progress Notes (Signed)
CRITICAL VALUE ALERT  Critical value received:  Calcium 6.3  Date of notification: 12/19/15  Time of notification:  1651  Critical value read back: yes  Nurse who received alert: Burnard BuntingKatrice Costa Jha, RN  MD notified:  Dr Alvia GroveMannem in e-Link  Time:  1715

## 2015-12-19 NOTE — Procedures (Signed)
LTM electroencephalogram   Data acquisition: international 10-20 for electrodes placement , 18 channels EEG with additional t1 and t2 electrodes and EKG  recording begins 12/18/15 at 08 33 21 am  recording ends 12/19/15 at 08 54 10 am   Day 1   This 24 hours of intensive EEG monitoring with simultaneous video monitoring ws performed for this patient s/p cardiac arrest , sedated , intubated   EEG tracing begins with attenuated background activates . During next 2-3 hours burst suppression  pattern emerges where suppressed intervals gradually shortens with eventually 3-4 seconds burst suppression ratio established throughout the rest of the recording.   There were no IED , clinical or subclinical seizures present  Clinical interpretation : this 24 hours intensive EEG monitoring with simultaneous video monitoring is abnormal and c/w burst suppression pattern suggestive of  severe encephalopathy of non specific etiology . Sedation status might contribute to this EEG pattern. Clinical correlation is advised.

## 2015-12-19 NOTE — Progress Notes (Signed)
Pharmacy Antibiotic Note  Tecia Edwyna ShellWoods V. is a 35 y.o. female admitted on 12/19/2015 with sepsis and rule out PCP.  Pharmacy has been consulted for Vancomycin, Zosyn, and Bactrim dosing.  This AM, patient with basically no UOP and SCr doubling.   Plan: Hold vancomycin - plan to dose based on levels going forward.  Reduce Zosyn to 2.25g IV every 8 hours.  Reduce Bactrim to 5mg /kg TMP (480mg ) IV every 12 hours.  Monitor renal function closely- recheck SCr this PM.  Follow-up renal plan per team.    Height: 5\' 5"  (165.1 cm) Weight: 214 lb 15.2 oz (97.5 kg) IBW/kg (Calculated) : 57  Temp (24hrs), Avg:100.3 F (37.9 C), Min:98.6 F (37 C), Max:101.3 F (38.5 C)   Recent Labs Lab 12/18/15 0253 12/18/15 0256 12/18/15 0445 12/19/15 0400  WBC  --  16.7* 16.5* 15.1*  CREATININE  --  2.14* 2.27* 3.61*  LATICACIDVEN 2.3*  --   --   --     Estimated Creatinine Clearance: 25.1 mL/min (by C-G formula based on Cr of 3.61).    Allergies  Allergen Reactions  . Oxycodone Itching    Antimicrobials this admission: 6/16 Vanc >>  6/16 Merrem >> 6/17 6/16 Azith >> 6/17 6/17 Zosyn >> 6/17 Bactrim >>  Dose adjustments this admission: 6/19-Reduce Zosyn from 3/375g IV q8h -4hr infusion to 2.25g IV q8h with no UOP, CrCl 25 6/19-Changed Bactrim from 300mg  IV q8h to 480mg  (5mg /kg) IV q12 for CrCl <30  Microbiology results: 6/18 Resp Culture (TA) >> 6/18 Blood Culture >> 6/17 MRSA pcr negative  Thank you for allowing pharmacy to be a part of this patient's care.  Link SnufferJessica Mireya Meditz, PharmD, BCPS Clinical Pharmacist [phone] 12/19/2015 8:05 AM

## 2015-12-19 NOTE — Progress Notes (Signed)
VASCULAR LAB PRELIMINARY  PRELIMINARY  PRELIMINARY  PRELIMINARY    Bilateral lower extremity venous duplex completed.     Bilateral:  No evidence of DVT, superficial thrombosis, or Baker's Cyst.  Gave results to SunolKatrice, RN  Jenetta Logesami Lupe Bonner, RVT, RDMS 12/19/2015, 11:53 AM

## 2015-12-20 ENCOUNTER — Inpatient Hospital Stay (HOSPITAL_COMMUNITY): Payer: Medicaid - Out of State

## 2015-12-20 DIAGNOSIS — G9340 Encephalopathy, unspecified: Secondary | ICD-10-CM

## 2015-12-20 LAB — BASIC METABOLIC PANEL
Anion gap: 19 — ABNORMAL HIGH (ref 5–15)
BUN: 68 mg/dL — ABNORMAL HIGH (ref 6–20)
CHLORIDE: 105 mmol/L (ref 101–111)
CO2: 14 mmol/L — AB (ref 22–32)
CREATININE: 4.47 mg/dL — AB (ref 0.44–1.00)
Calcium: 6.2 mg/dL — CL (ref 8.9–10.3)
GFR, EST AFRICAN AMERICAN: 14 mL/min — AB (ref >60)
GFR, EST NON AFRICAN AMERICAN: 12 mL/min — AB (ref >60)
Glucose, Bld: 76 mg/dL (ref 65–99)
POTASSIUM: 5.4 mmol/L — AB (ref 3.5–5.1)
SODIUM: 138 mmol/L (ref 135–145)

## 2015-12-20 LAB — CULTURE, RESPIRATORY

## 2015-12-20 LAB — BLOOD GAS, ARTERIAL
Acid-base deficit: 13.9 mmol/L — ABNORMAL HIGH (ref 0.0–2.0)
Bicarbonate: 12.8 mEq/L — ABNORMAL LOW (ref 20.0–24.0)
Drawn by: 449561
FIO2: 0.8
MECHVT: 500 mL
O2 Saturation: 97.7 %
PATIENT TEMPERATURE: 100
PEEP: 14 cmH2O
PO2 ART: 151 mmHg — AB (ref 80.0–100.0)
RATE: 35 resp/min
TCO2: 14 mmol/L (ref 0–100)
pCO2 arterial: 37.7 mmHg (ref 35.0–45.0)
pH, Arterial: 7.165 — CL (ref 7.350–7.450)

## 2015-12-20 LAB — CBC
HEMATOCRIT: 25.9 % — AB (ref 36.0–46.0)
HEMOGLOBIN: 8.4 g/dL — AB (ref 12.0–15.0)
MCH: 32.8 pg (ref 26.0–34.0)
MCHC: 32.4 g/dL (ref 30.0–36.0)
MCV: 101.2 fL — AB (ref 78.0–100.0)
Platelets: 49 10*3/uL — ABNORMAL LOW (ref 150–400)
RBC: 2.56 MIL/uL — ABNORMAL LOW (ref 3.87–5.11)
RDW: 21.3 % — ABNORMAL HIGH (ref 11.5–15.5)
WBC: 13 10*3/uL — AB (ref 4.0–10.5)

## 2015-12-20 LAB — HEPARIN LEVEL (UNFRACTIONATED): HEPARIN UNFRACTIONATED: 0.18 [IU]/mL — AB (ref 0.30–0.70)

## 2015-12-20 LAB — MAGNESIUM: MAGNESIUM: 2.5 mg/dL — AB (ref 1.7–2.4)

## 2015-12-20 LAB — CULTURE, RESPIRATORY W GRAM STAIN: Culture: NO GROWTH

## 2015-12-20 LAB — PHOSPHORUS: Phosphorus: 9.4 mg/dL — ABNORMAL HIGH (ref 2.5–4.6)

## 2015-12-20 LAB — GLUCOSE, CAPILLARY
GLUCOSE-CAPILLARY: 104 mg/dL — AB (ref 65–99)
GLUCOSE-CAPILLARY: 126 mg/dL — AB (ref 65–99)
Glucose-Capillary: 95 mg/dL (ref 65–99)
Glucose-Capillary: 78 mg/dL (ref 65–99)
Glucose-Capillary: 89 mg/dL (ref 65–99)

## 2015-12-20 LAB — VANCOMYCIN, RANDOM: Vancomycin Rm: 53 ug/mL

## 2015-12-20 MED ORDER — MORPHINE BOLUS VIA INFUSION
5.0000 mg | INTRAVENOUS | Status: DC | PRN
  Filled 2015-12-20: qty 20

## 2015-12-20 MED ORDER — SODIUM CHLORIDE 0.9 % IV SOLN
500.0000 mg | Freq: Two times a day (BID) | INTRAVENOUS | Status: DC
  Administered 2015-12-20: 500 mg via INTRAVENOUS
  Filled 2015-12-20 (×2): qty 5

## 2015-12-20 MED ORDER — SODIUM CHLORIDE 0.9 % IV SOLN
10.0000 mg/h | INTRAVENOUS | Status: DC
  Administered 2015-12-20 (×2): 10 mg/h via INTRAVENOUS
  Filled 2015-12-20: qty 10

## 2015-12-20 MED ORDER — MIDAZOLAM BOLUS VIA INFUSION (WITHDRAWAL LIFE SUSTAINING TX)
5.0000 mg | INTRAVENOUS | Status: DC | PRN
  Filled 2015-12-20: qty 20

## 2015-12-20 MED ORDER — MORPHINE SULFATE 25 MG/ML IV SOLN
10.0000 mg/h | INTRAVENOUS | Status: DC
  Administered 2015-12-20 (×2): 10 mg/h via INTRAVENOUS
  Filled 2015-12-20: qty 10

## 2015-12-22 ENCOUNTER — Telehealth: Payer: Self-pay

## 2015-12-22 NOTE — Telephone Encounter (Signed)
On 12/22/2015 I received a death certificate from [employer] (original). The death certificate is for cremation. The patient is a patient of Doctor Molli KnockYacoub. The death certificate will be [facility] to [facility] ([id number]) for signature. On 12/22/2015 I received the death certificate back from Doctor Molli KnockYacoub. I got the death certificate ready and called the funeral home to let them know the death certificate is ready for pickup. I also faxed them a copy per their request.

## 2015-12-23 LAB — CULTURE, BLOOD (ROUTINE X 2)
CULTURE: NO GROWTH
Culture: NO GROWTH

## 2015-12-31 NOTE — Progress Notes (Signed)
   Jan 18, 2016 1400  Clinical Encounter Type  Visited With Patient;Family;Patient and family together;Health care provider  Visit Type Initial;Follow-up;Psychological support;Spiritual support;Social support;Critical Care;Patient actively dying  Referral From Family;Care management  Consult/Referral To Chaplain;Nurse;Faith community  Spiritual Encounters  Spiritual Needs Prayer;Emotional;Grief support  Stress Factors  Patient Stress Factors None identified  Family Stress Factors Loss;Loss of control   Chaplain responded to a page for a end of life case on 95m room 01. Chaplain met with family and provided emotional and spiritual care via prayer. Chaplain will continue to sit with family. Please page if there is any more emotional/spiritual   needs.    BDante Gang CHoaglandThanks

## 2015-12-31 NOTE — Progress Notes (Signed)
PULMONARY / CRITICAL CARE MEDICINE   Name: Sherry Strickland MRN: [id number] DOB: 1980-08-09    ADMISSION DATE:  12/16/2015 CONSULTATION DATE:  12/15/2015  [facility] MD:  [facility]  CHIEF COMPLAINT:  Cardiac arrest, pneumonia, shock  HISTORY OF PRESENT ILLNESS:   Ms. Tolsma is a 82F with PMH significant for HIV who presented to the [facility] ED on 12/16/15 with complaints of shortness of breath, cough, and fevers. She was febrile, tachy, hypotensive, tachypneic and had a new oxygen requirement. Labs were notable for a metabolic acidosis, acute kidney injury, leukopenia (2.7), and mild elevations of alkP and AST. She was admitted for sepsis 2/2 multifocal pneumonia and started on rocephin and azithromycin.  She has reportedly been on antiretroviral therapy with Reyataz, Truvada and Norvir. Once admitted, her antibiotics were broadened to vanc, merrem, azithro. Crypto antigen was sent.  On 12/24/2015, she suffered an acute cardiac arrest at 12:55pm. ACLS was initiated and 2 rounds of CPR, 2 amps of EPI, 2 shocks (for VT and VF) and amiodarone were given. ROSC at 1:01pm. Bedside echo showed possible RV strain with TR concerning for possible acute PE and heparin gtt was initiated. She was started on pressors. Additional workup prior to transfer included a spot EEG, but no results were included in the record. CD4 count and viral load were reportedly sent at the outside hospital. On arrival to the MICU, she was responsive only to painful stimuli. She continues on norepi, a bicarb gtt, and propofol for sedation. She exhibits intermittent twitching/jerking movements.   SUBJECTIVE:  RN reports periods of tachypnea up to the 50's with stimulus. Remains on 12 peep/0.80 FiO2. Unresponsive. Last sedation - fentanyl 6/18 @ 0600 and propofol 6/18 @ 1435. Off levophed 6/19 am. Worsening renal function/oliguria.   VITAL SIGNS: BP 118/84 mmHg  Pulse 96  Temp(Src) 99 F (37.2 C) (Core (Comment))   Resp 40  Ht 5\' 5"  (1.651 m)  Wt 224 lb 3.3 oz (101.7 kg)  BMI 37.31 kg/m2  SpO2 100%  LMP  (LMP Unknown)  HEMODYNAMICS:    VENTILATOR SETTINGS: Vent Mode:  [-] PRVC FiO2 (%):  [80 %-100 %] 80 % Set Rate:  [35 bmp] 35 bmp Vt Set:  [500 mL] 500 mL PEEP:  [14 cmH20] 14 cmH20 Plateau Pressure:  [25 cmH20-33 cmH20] 25 cmH20  INTAKE / OUTPUT: I/O last 3 completed shifts: In: 6455.6 [P.O.:2; I.V.:4129.4; NG/GT:150; IV Piggyback:2174.3] Out: 193 [Urine:193]  PHYSICAL EXAMINATION:  General Well nourished, well developed, obese, intubated, off sedation, unresponsive  HEENT Pupils 4 mm bilaterally and minimally responsive, no corneals, no dolls eye, no gag. ETT / OGT in place. Poor dentition.   Pulmonary Coarse bilaterally with rales R base > left base with symmetrical expansion on mechanical ventilation. Tachypnea.   Cardiovascular tachycardic, regular rhythm. S1, s2. No m/r/g. Distal pulses palpable.  Abdomen Obese, mildly distended, positive bowel sounds, no palpable organomegaly or masses.   Musculoskeletal Grossly normal   Lymphatics No cervical, supraclavicular or axillary adenopathy.   Neurologic Grossly intact. No focal deficits.   Skin/Integuement No rash, no cyanosis, no clubbing. Multiple tattoos. Mottling on hands R>L and cool. Mottling on legs, severe below sock line with purplish discoloration at sock line cold.   LABS:    BMET  Recent Labs Lab 12/19/15 0400 12/19/15 1630 12-29-2015 0440  NA 140 139 138  K 4.7 5.2* 5.4*  CL 111 111 105  CO2 14* 14* 14*  BUN 55* 58* 68*  CREATININE 3.61* 4.16* 4.47*  GLUCOSE 111* 92 76   Electrolytes  Recent Labs Lab 12/18/15 0445 12/19/15 0400 12/19/15 1630 Jan 19, 2016 0440  CALCIUM 6.6* 6.5* 6.3* 6.2*  MG 2.3  --   --  2.5*  PHOS 6.2*  --   --  9.4*   CBC  Recent Labs Lab 12/18/15 0445 12/19/15 0400 Jan 19, 2016 0440  WBC 16.5* 15.1* 13.0*  HGB 9.9* 8.9* 8.4*  HCT 29.9* 27.3* 25.9*  PLT 93* 62* 49*    Coag's No results for input(s): APTT, INR in the last 168 hours.  Sepsis Markers  Recent Labs Lab 12/18/15 0253  LATICACIDVEN 2.3*   ABG  Recent Labs Lab 12/19/15 0325 12/19/15 1528 Jan 19, 2016 0407  PHART 7.209* 7.123* 7.165*  PCO2ART 36.6 39.3 37.7  PO2ART 169.0* 206.0* 151*   Liver Enzymes  Recent Labs Lab 12/18/15 0256 12/19/15 0400  AST 80* 55*  ALT 49 39  ALKPHOS 107 99  BILITOT 2.0* 1.6*  ALBUMIN 2.0* 2.0*   Cardiac Enzymes  Recent Labs Lab 12/18/15 0256 12/18/15 0445 12/18/15 1218  TROPONINI 2.83* 3.50* 2.06*   Glucose  Recent Labs Lab 12/19/15 1318 12/19/15 1508 12/19/15 1933 12/19/15 2348 Jan 19, 2016 0325 Jan 19, 2016 0844  GLUCAP 129* 99 90 104* 78 89   Imaging Ct Head Wo Contrast  10-16-15  ADDENDUM REPORT: 004-16-17 10:33 ADDENDUM: Critical Value/emergent results were called by telephone at the time of interpretation on 10-16-15 at 10:33 am to Dr. Amada JupiterKirkpatrick, who verbally acknowledged these results. Electronically Signed   By: Elberta Fortisaniel  Boyle M.D.   On: 004-16-17 10:33  10-16-15  CLINICAL DATA:  Acute encephalopathy. HIV positive. Recent cardiac arrest. EXAM: CT HEAD WITHOUT CONTRAST TECHNIQUE: Contiguous axial images were obtained from the base of the skull through the vertex without intravenous contrast. COMPARISON:  12/18/2015 FINDINGS: Ventricles and cisterns are within normal. There has been interval development of diffuse bilateral cerebral edema with sulcal effacement and ill definition of the gray-white junction likely due to anoxic injury correlating with history of recent cardiac arrest. No midline shift. Tiny linear hyperdense focus over the right frontal region adjacent the midline which may represent acute subarachnoid hemorrhage. Small focal hyperdensity over the more inferior anterior right frontal region likely additional acute subarachnoid hemorrhage. Remainder of the exam is unremarkable. IMPRESSION: Interval development of  diffuse cerebral edema likely secondary to anoxic injury. No midline shift. Suggestion of small foci of acute subarachnoid hemorrhage over the right frontal region. Electronically Signed: By: Elberta Fortisaniel  Boyle M.D. On: 004-16-17 10:24   Dg Chest Port 1 View  10-16-15  CLINICAL DATA:  Shortness of breath. EXAM: PORTABLE CHEST 1 VIEW COMPARISON:  12/19/2015. FINDINGS: Endotracheal tube tip 3 cm above the carina. NG tube, right IJ line stable position. Stable cardiomegaly. Persistent bilateral pulmonary infiltrates, right side greater than left with slight interim improvement. No significant pleural effusion. No pneumothorax. IMPRESSION: 1. Endotracheal tube tip 3 cm above the carina. NG tube, right IJ line stable position. 2. Persistent bilateral pulmonary infiltrates and/or edema right side greater than left with slight interim improvement. Electronically Signed   By: Maisie Fushomas  Register   On: 004-16-17 06:53   STUDIES:  CT Chest WO 12/16/15 >> Large masslike consolidation in the right upper lobe with smaller consolidation in the right lower lobe and clustered centrilobular nodules in the right middle lobe concerning for multifocal pneumonia. Recommend follow up to radiographic resolution .  TTE 12/08/2015 There is normal left ventricular wall thickness.There is mild to moderate global hypokinesis of the left ventricle. Ejection Fraction =  35-45%. The right ventricle is mildly dilated. The right ventricular systolic function is mildly reduced. There is mild mitral regurgitation. There is mild to moderate tricuspid regurgitation. Right ventricular systolic pressure is elevated at 30-15mmHg.  EEG 6/17 >> Interpretation: Severe suppressed and significant diffuse slowing background indicates severe generalized neurophysiological disturbance. The diffuse slowing can be seen in patients with  toxic or metabolic disorders, neurodegenerative disorders, or recent seizure. Again, intermittent muscle contraction artifacts  from myoclonus was observed without epileptiform discharges correlation.   CT Head 6/20 >> interval development of diffuse cerebral edema likely secondary to anoxic injury, no midline shift, small foci of acute subarachnoid hemorrhage in the right frontal region  EEG 6/19 >> 24 hour, abnormal and c/w burst suppresion pattern suggestive of severe encephalopathy of non specific etiology. There were no IED, clinical or subclinical seizures present. Sedatioin status might contribute to the EEG pattern. (pt had just been d/c'd off fentanyl, was on propofol for Initial 7 hrs of test)  Venous Duplex 6/20 >> no evidence of DVT, superficial thrombosis, or Baker's Cyst   CULTURES: Blood OSH 6/16 >> Blood 6/17 >> Urine OSH 6/16 >> Crypto Ag >> negative  ANTIBIOTICS: Vanc / Merrem / Azithro 6/16 >> 6/17 Vanc / zosyn / bactrim 6/17 >>  SIGNIFICANT EVENTS: 6/17  Admitted to [facility] with PNA, intubated post arrest.  Tx to [facility] 6/19  Code status changed to DNR  LINES/TUBES: RIJ 6/17 >> ETT 6/17 >> Foley 6/17 >> R Rad Aline 6/18 >>  DISCUSSION:  Ms. Miguez is a 71 F who presented to an outside hospital with sepsis thought secondary to multifocal pneumonia. She then suffered a cardiac arrest with documentation of both VT and VF. Post-code TTE was suggestive of RH strain and empiric heparin was initiated. Other significant findings include UDS positive for amphetamine and benzos, acute kidney injury (baseline unknown), myoclonic jerks on exam and outside EEG, systolic dysfunction with EF 20-25% and underlying HIV/AIDS. Myoclonus has resolved with Keppra. EEG x 2 have consistently severe encephalopathy. She has remained unresponsive on vent without sedation x almost 24 hrs. Made a DNR on 6/19 after discussions with family. Will need to discuss withdrawal of therapy on 6/20 after head CT since neuro status has not improved. EEG and CT head with on 6/20 concerning for grim prognosis.     ASSESSMENT / PLAN:  PULMONARY A: Acute hypoxemic respiratory failure Multifocal pneumonia Concern for pulmonary embolism P:   Trend CXR Ventilatory support; no weaning given neuro status Continue empiric antibiotics with vanc / zosyn / bactrim Discontinue heparin with SAH noted on CT, thrombocytopenia  Titrate FiO2 and PEEP as able.  CARDIOVASCULAR A:  Cardiac arrest (VT/VF) Shock (cardiogenic vs distributive)-->pressor needs improving Systolic dysfunction EF 20-25% (chronicity unknown) RV dilation w/ reduced systolic function P:  D/c heparin as above Trend lactate Monitor in ICU  RENAL A:   Acute kidney injury (baseline unknown) 3.53 to 2.04 at OSH; 1.03 in 07/2014 Metabolic acidosis / AG P:   Maintain MAP > 65. Continue Bicarb gtt @ 100 ml/hr Trend Cr, lytes, UOP. Avoid nephrotoxins. Replace electrolytes as indicated. Will need CRRT if family desires ongoing support  GASTROINTESTINAL A:   No acute issues P:   TF per nutrition. GI ppx   HEMATOLOGIC A:   Concern for acute PE P:  D/c heparin gtt with thrombocytopenia, SAH CBC in AM.  INFECTIOUS A:   Multifocal pneumonia; risk factors for PCP HIV - CD4 <1 on admit P:   Continue  empiric vanc/zosyn/bactrim. Follow outside cultures. Will restart antivirals if care proceeds ID consult called.  ENDOCRINE A:   Hyperglycemia - resolved, likely iatrogenic from D5 gtt   P:   CBG / SSI as needed  NEUROLOGIC A:   Altered mental status with myoclonic jerks; etiology unclear but concerning for HIE Small frontal SAH P:   RASS goal: 0. Empiric Keppra  FAMILY  - Updates:  Family updated on status per Dr. Molli Knock 6/20    Canary Brim, NP-C [facility] Pgr: [phone] or if no answer [phone] December 22, 2015, 11:53 AM  Attending Note:  35 year old female with history of methamphetamine abuse who presents to the hospital after a cardiac arrest.  The patient's neurologic exam has been  very poor.  No gag, corneal or dolls eye.  Respiratory drive present.  I ordered a head CT this AM and reviewed it myself with radiology with diffuse edema.  Discussed with neuro, patient has no reasonable chance at recovery and will likely progress to brain death.  Spoke with family, after discussion, decision was made to make patient a full DNR with withdrawal of care when family is ready.  Withdrawal orderset will be filled.  The patient is critically ill with multiple organ systems failure and requires high complexity decision making for assessment and support, frequent evaluation and titration of therapies, application of advanced monitoring technologies and extensive interpretation of multiple databases.   Critical Care Time devoted to patient care services described in this note is  35  Minutes. This time reflects time of care of this signee Dr Koren Bound. This critical care time does not reflect procedure time, or teaching time or supervisory time of PA/NP/Med student/Med Resident etc but could involve care discussion time.  Alyson Reedy, M.D. [facility] Pulmonary/Critical Care Medicine. Pager: [phone]. After hours pager: [phone].

## 2015-12-31 NOTE — Procedures (Signed)
Extubation Procedure Note  Patient Details:   Name: Sherry DuraCrystal Rominger V. DOB: 1981-02-12 MRN: [id number]   Airway Documentation:     Evaluation  O2 sats: currently acceptable Complications: No apparent complications Patient did tolerate procedure well. Bilateral Breath Sounds: Diminished, Rhonchi   No   Pt terminally extubated to RA.  RN at bedside given morphine.  Family present.  RT will continue to monitor.  Pt is DNR.  Sherry Strickland 11/04/15, 2:14 PM

## 2015-12-31 NOTE — Progress Notes (Signed)
MS 200cc and Versed 10cc wasted in sink. Witnessed by Rod MaeJoanna Lindsay, RN.

## 2015-12-31 NOTE — Progress Notes (Signed)
Subjective: No changes  Exam: Filed Vitals:   12/24/2015 0500 12/24/2015 0600  BP: 109/65 109/79  Pulse: 101 100  Temp: 99.9 F (37.7 C) 99.5 F (37.5 C)  Resp: 40 40   Gen: In bed, intubated Resp: ventilated, breathing rapidly over ventilator.  Abd: soft, nt  Neuro: MS: partial eye opening to noxious stimuli. Does not follow commands CN: PERRL, corneals present bilaterally Motor: No motor response to noxious stimuli in either arm or left leg. She has mild extensor response to stimulus in the right leg.  Sensory:as above.    Impression: 35 yo F s/p cardiac arrest with signs concerning for poor prognosis. She has extensor motor response in a single limb at this point, which at 72 hours is felt to be highly sensitive for poor prognosis. Her EEG also showed a pattern typically felt to be malignant(burst suppression absent any sedation). I did not witness the myoclonus, as it has responded to keppra therapy, but generalized myoclonus is also specific for poor prognosis.   Recommendations: 1) continue keppra for myoclonus 2) will continue to follow.    Ritta SlotMcNeill Inaya Gillham, MD [facility] [phone]  If 7pm- 7am, please page neurology on call as listed in AMION.

## 2015-12-31 NOTE — Progress Notes (Signed)
No BP, no heart tones or  pulse, no resp effort, pupils non reactive. Verified by Irven CoeLauren Robbins, RN

## 2015-12-31 NOTE — Discharge Summary (Signed)
[name], Sherry               ACCOUNT NO.:  [id number]  MEDICAL RECORD NO.:  [id number]  LOCATION:  2M01C                        FACILITY:  [facility]  PHYSICIAN:  Felipa EvenerWesam Jake Sade Hollon, MD  DATE OF BIRTH:  07-02-1981  DATE OF ADMISSION:  February 29, 2016 DATE OF DISCHARGE:  12/23/2015                              DISCHARGE SUMMARY   PRIMARY DIAGNOSIS/CAUSE OF DEATH:  Pulseless electrical activity cardiac arrest.  SECONDARY DIAGNOSES:  Methamphetamine overdose, acute respiratory failure with hypoxemia, aspiration pneumonias, essential hypertension, and anoxic brain injury.  The patient is a 35 year old female with HIV, hepatitis C, methamphetamine abuser, who reportedly became short of breath, vomited, aspirated, had a cardiac arrest.  UDS positive for methamphetamine.  The patient arrested with __________ outside facility and [facility].  Decision was made to use the hypothermia protocol given her neurologic exam.  CT scan initially performed, that was negative for acute injury; however, followup CT showed diffuse edema.  EEG was persistent with anoxic injury from the day of admission.  I had a meeting with the family, I explained them the severity of the situation and that the patient has no reasonable chance of recovery, at which point, decision was made to make sure that the patient is comfortable, the patient was started on morphine and was extubated, expired shortly thereafter with the family at bedside.     Felipa EvenerWesam Jake Monterio Bob, MD     WJY/MEDQ  D:  12/21/2015  T:  12/21/2015  Job:  [id number]

## 2015-12-31 NOTE — Care Management Note (Signed)
Case Management Note  Patient Details  Name: Sherry Strickland. MRN: [id number] Date of Birth: 08-23-1980  Subjective/Objective: Pt admitted with pnemonia                   Action/Plan:  Per sister at bedside;  Pt was independent prior to admit - [place].  Family meeting has been requested - prognosis is documented as poor.   CM will continue to follow for discharge plan   Expected Discharge Date:                  Expected Discharge Plan:  Home w Home Health Services  In-House Referral:     Discharge planning Services  CM Consult  Post Acute Care Choice:    Choice offered to:     DME Arranged:    DME Agency:     HH Arranged:    HH Agency:     Status of Service:  In process, will continue to follow  Medicare Important Message Given:    Date Medicare IM Given:    Medicare IM give by:    Date Additional Medicare IM Given:    Additional Medicare Important Message give by:     If discussed at Long Length of Stay Meetings, dates discussed:    Additional Comments:  Cherylann ParrClaxton, Jahaad Penado S, RN 11/20/2015, 11:48 AM

## 2015-12-31 DEATH — deceased

## 2017-07-26 IMAGING — CR DG CHEST 1V PORT
1 series · 1 of 1 positions shown · non-contrast
Comparison: 07/05/2015

CLINICAL DATA: Myoclonus.  Tube placement.

EXAM:
PORTABLE CHEST 1 VIEW

[AP]
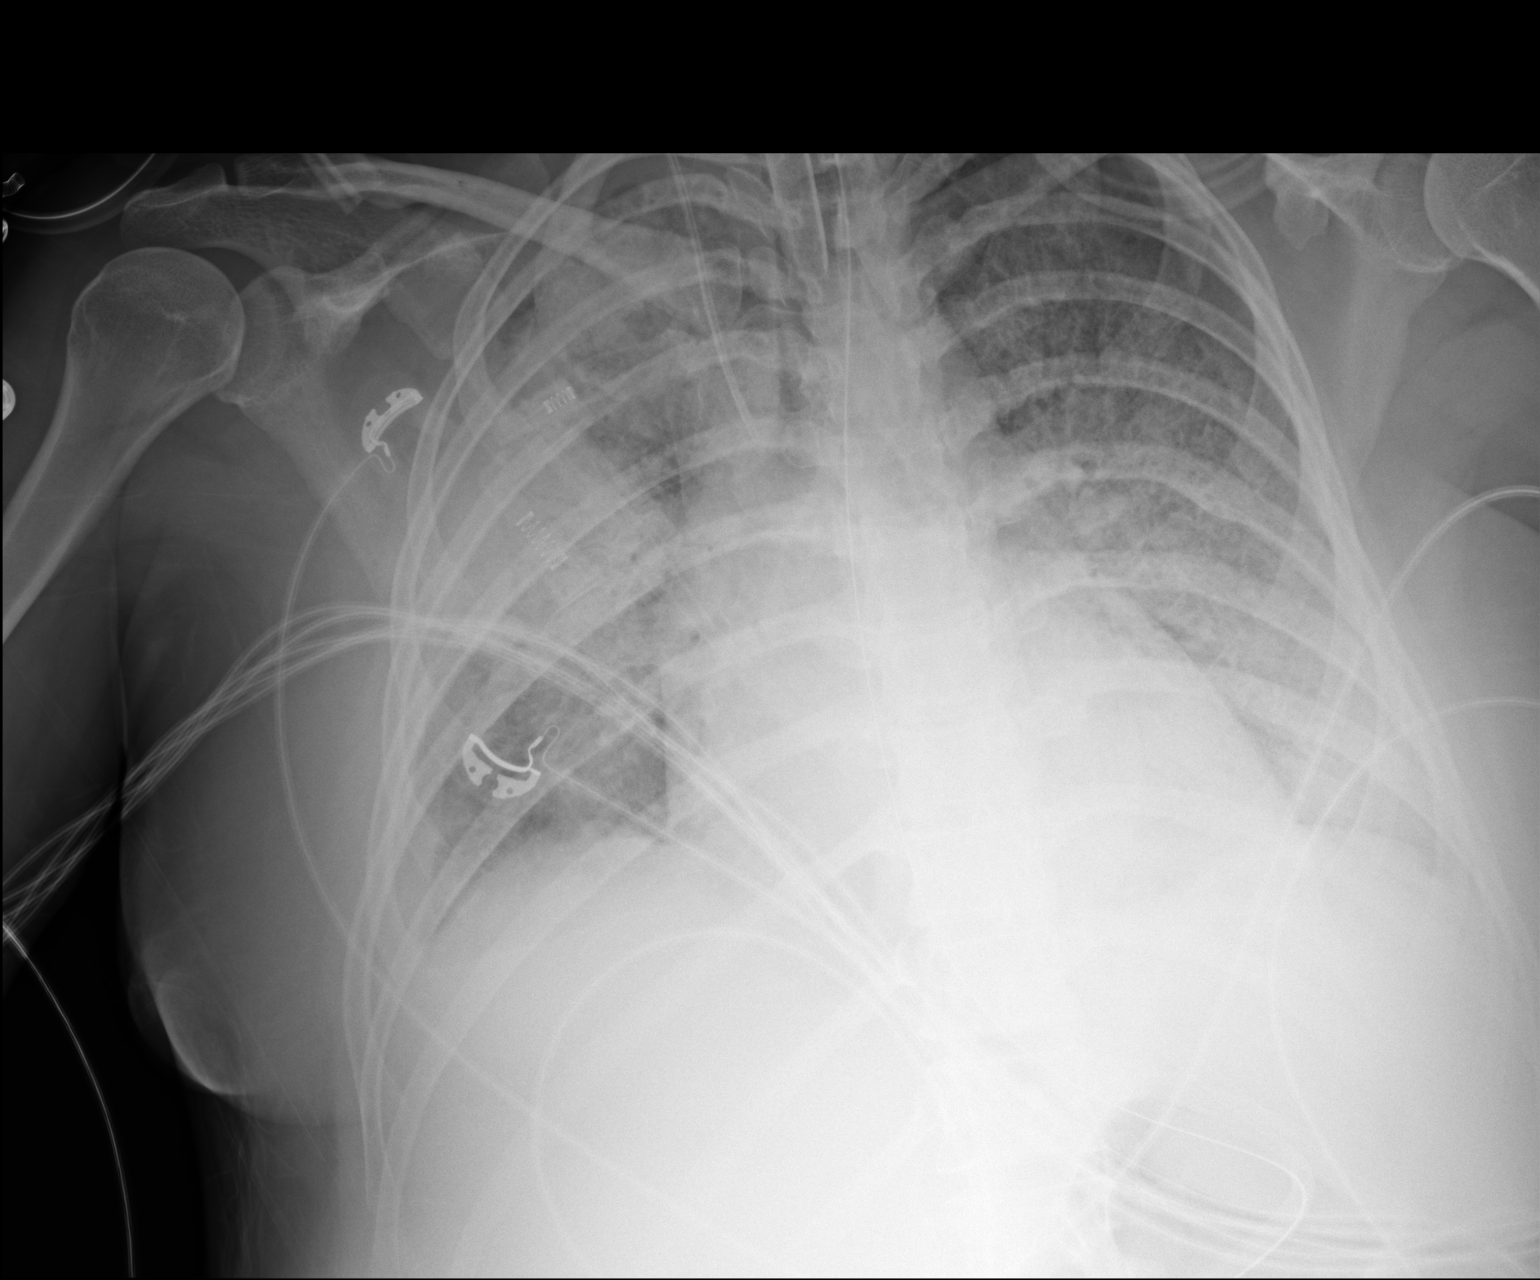

[1 of 1 positions shown; findings below may reference images not displayed]

FINDINGS: Endotracheal tube tip projects 3.8 cm above the car melena.

Nasal/ orogastric tube passes well below the diaphragm into the
stomach.

Right internal jugular central venous line tip projects in the mid
superior vena cava.

There are bilateral airspace opacities with evidence of a small
right pleural effusion. No pneumothorax.

Cardiac silhouette is normal in size. No mediastinal or hilar
masses.
IMPRESSION: 1. Lines and tubes well positioned as described.  No pneumothorax.
2. Bilateral airspace lung opacities and small right pleural
effusion. Findings may reflect pulmonary edema or bilateral
pneumonia.

## 2017-07-27 IMAGING — CT CT HEAD W/O CM
3 series · 16 of 47 positions shown, 19 images · non-contrast
Comparison: None.

CLINICAL DATA: Myoclonus.  Post cardiac arrest.

EXAM:
CT HEAD WITHOUT CONTRAST
TECHNIQUE: Contiguous axial images were obtained from the base of the skull
through the vertex without intravenous contrast.

[Series 2: head 5.0 h30s · axial · 0.43mm/px · z∈[-191,-51]mm · 10 of 34 slices shown, 13 images]
[im 3/34  brain]
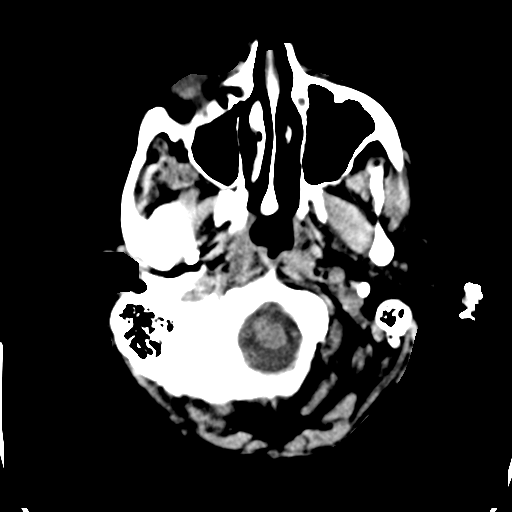
[im 3/34  bone]
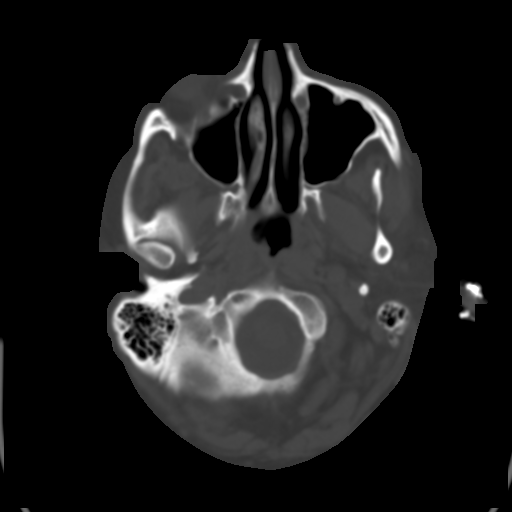
[im 6/34  brain]
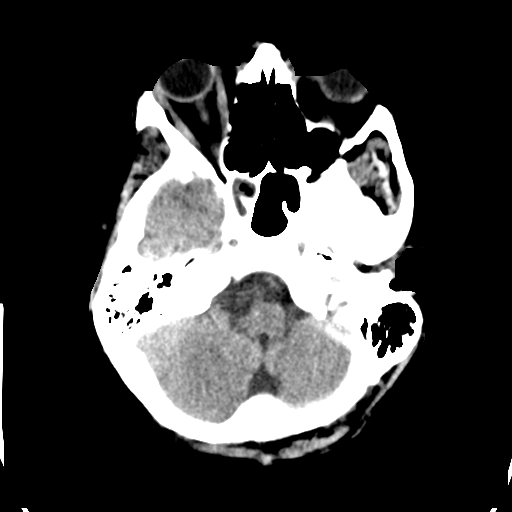
[im 10/34  brain]
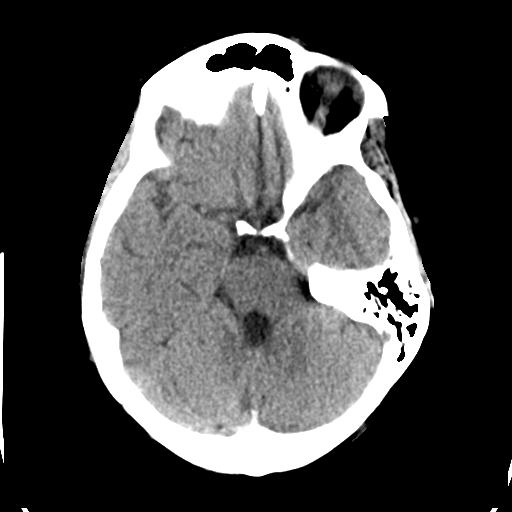
[im 12/34  brain]
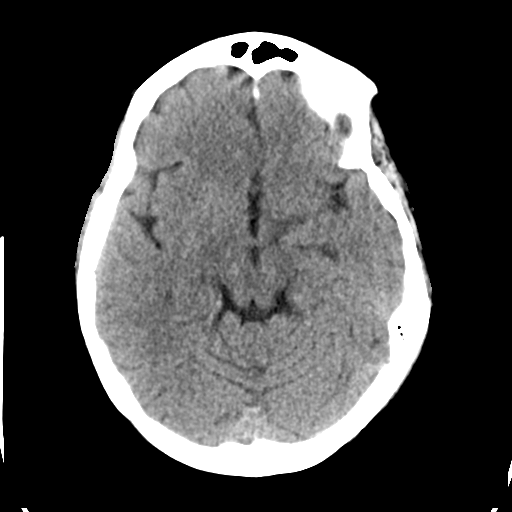
[im 15/34  brain]
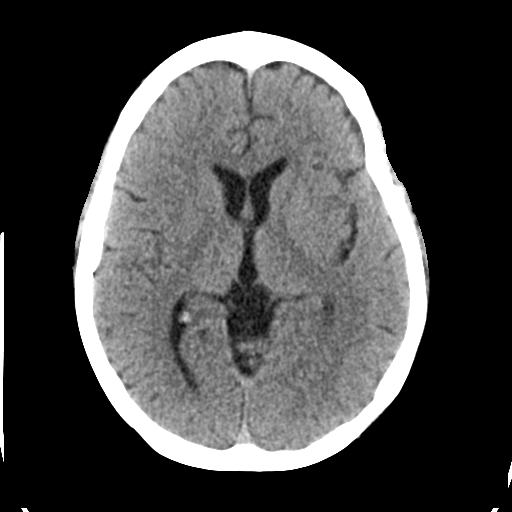
[im 15/34  bone]
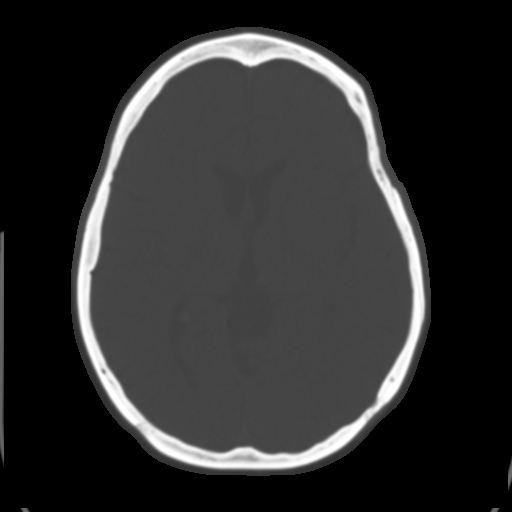
[im 19/34  brain]
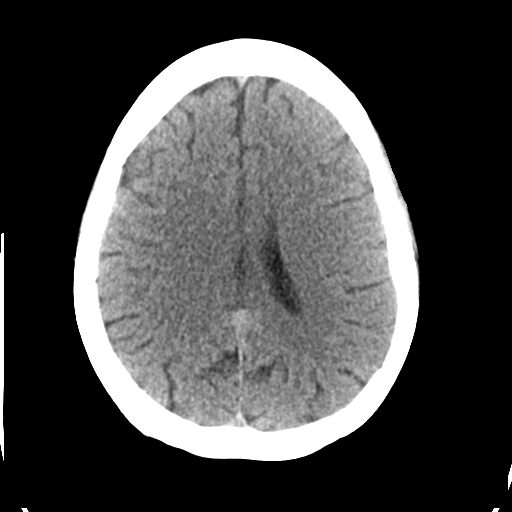
[im 22/34  brain]
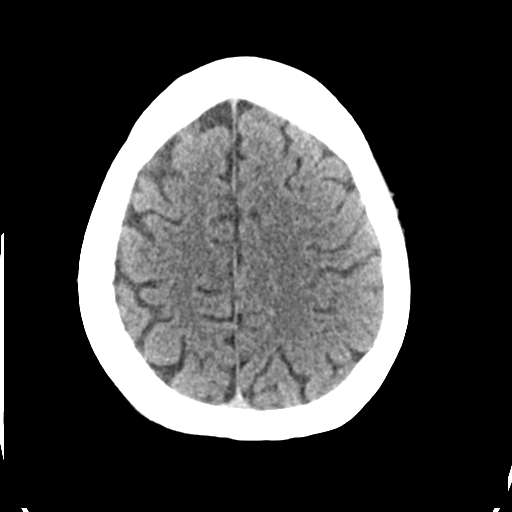
[im 26/34  brain]
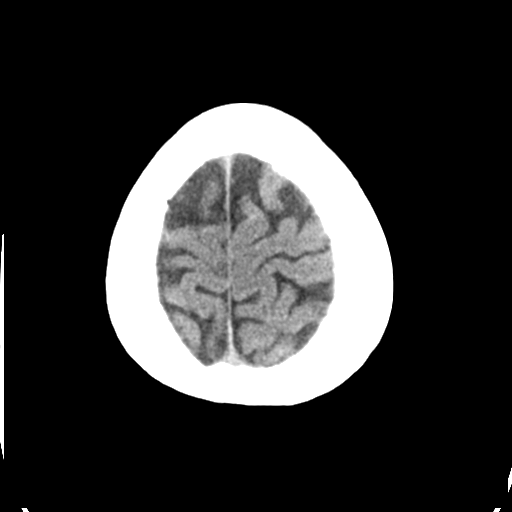
[im 28/34  brain]
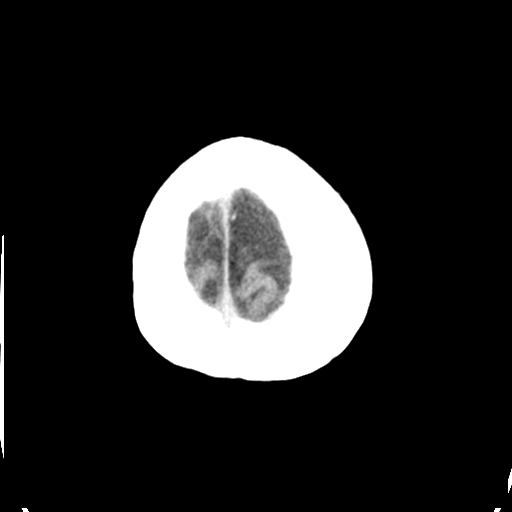
[im 28/34  bone]
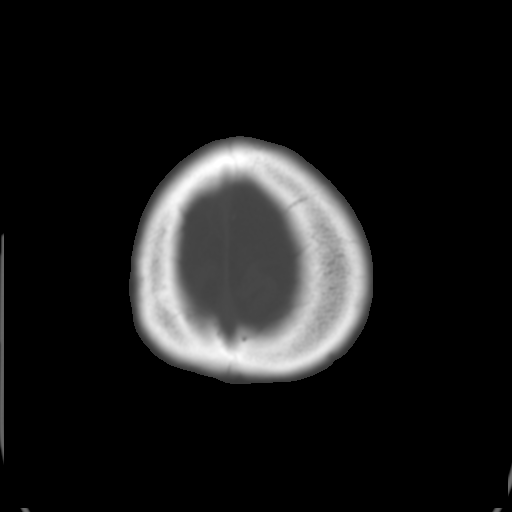
[im 31/34  brain]
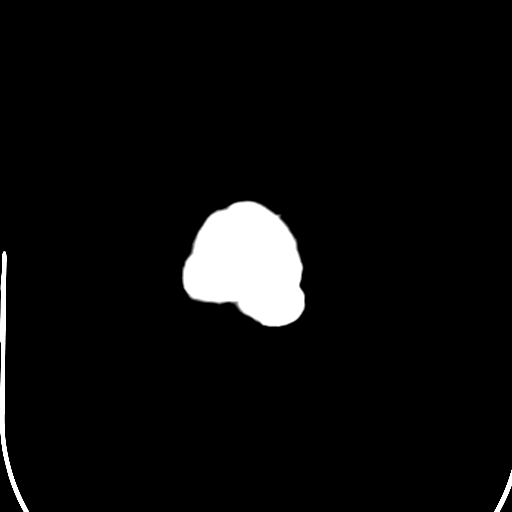

[Series 4: head 3.0 mpr · coronal · 0.34mm/px · 3 of 70 slices shown (1 of 2)]
[im 24/70  brain]
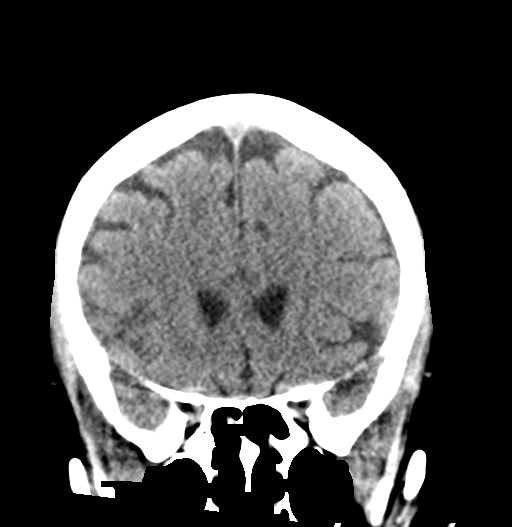
[im 31/70  brain]
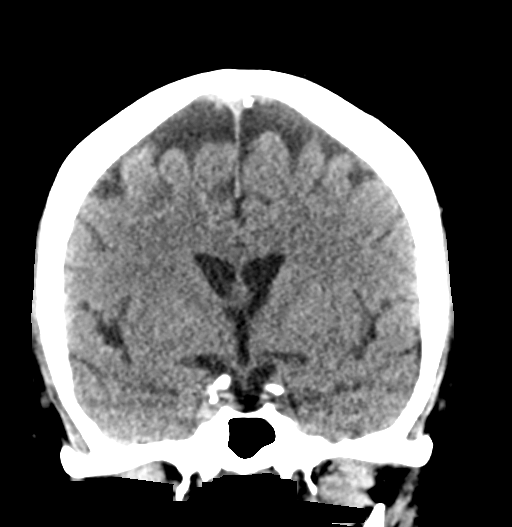
[im 39/70  brain]
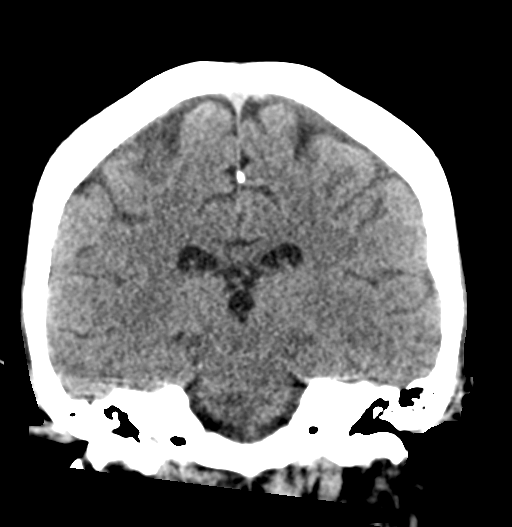

[Series 5: head 3.0 mpr · sagittal · 0.33mm/px · 3 of 65 slices shown (2 of 2)]
[im 25/65  brain]
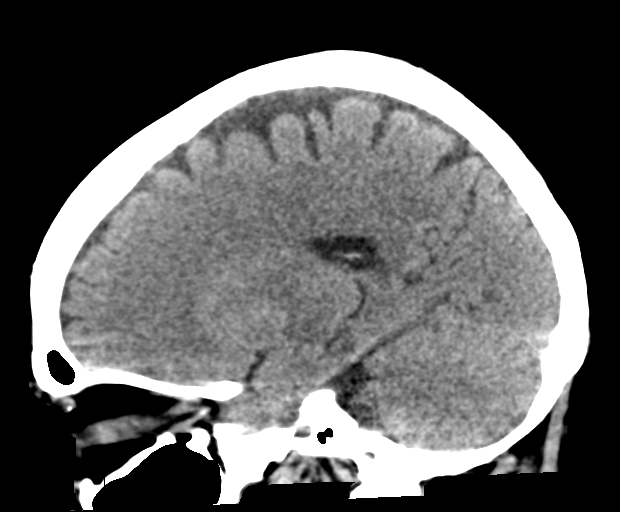
[im 33/65  brain]
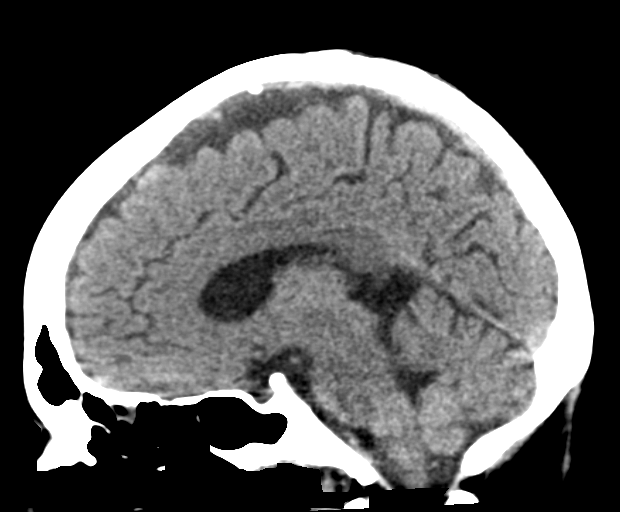
[im 40/65  brain]
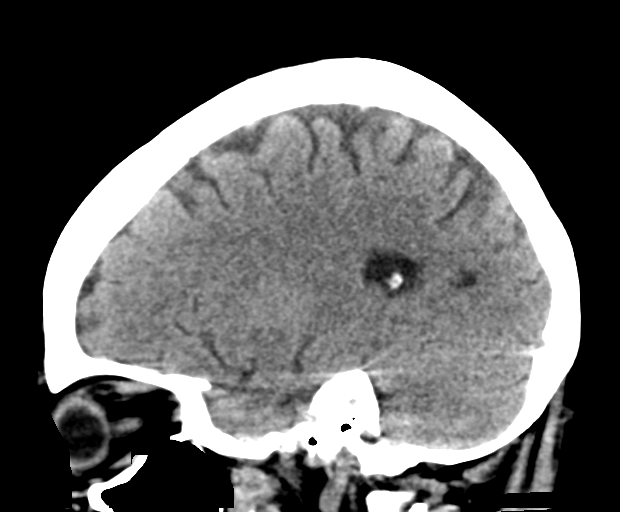

[16 of 47 positions shown; findings below may reference images not displayed]

FINDINGS: Brain: No intracranial hemorrhage, mass effect, or midline shift. No
evidence of cerebral edema. No hydrocephalus. The basilar cisterns
are patent. No evidence of territorial infarct. No intracranial
fluid collection.

Vascular: No hyperdense vessel or abnormal calcification.

Skull:  Calvarium is intact.

Sinuses/Orbits: Mucosal thickening and opacity in the right side of
sphenoid sinus, scattered mucosal thickening throughout the ethmoid
air cells. Mastoid air cells are well aerated.

Other: None.
IMPRESSION: No acute intracranial abnormality.
